# Patient Record
Sex: Male | Born: 1950
Health system: Southern US, Community
[De-identification: ages and names within clinical notes are randomized; demographics above are authoritative.]

## PROBLEM LIST (undated history)

## (undated) DIAGNOSIS — M109 Gout, unspecified: Secondary | ICD-10-CM

## (undated) DIAGNOSIS — Z87442 Personal history of urinary calculi: Secondary | ICD-10-CM

## (undated) DIAGNOSIS — I10 Essential (primary) hypertension: Secondary | ICD-10-CM

## (undated) HISTORY — PX: TONSILLECTOMY: SUR1361

---

## 2002-11-30 ENCOUNTER — Encounter (INDEPENDENT_AMBULATORY_CARE_PROVIDER_SITE_OTHER): Payer: Self-pay | Admitting: Specialist

## 2002-11-30 ENCOUNTER — Ambulatory Visit (HOSPITAL_COMMUNITY): Admission: RE | Admit: 2002-11-30 | Discharge: 2002-11-30 | Payer: Self-pay | Admitting: Gastroenterology

## 2003-01-27 ENCOUNTER — Encounter: Payer: Self-pay | Admitting: Rheumatology

## 2003-01-27 ENCOUNTER — Encounter: Admission: RE | Admit: 2003-01-27 | Discharge: 2003-01-27 | Payer: Self-pay | Admitting: Rheumatology

## 2003-12-02 ENCOUNTER — Emergency Department (HOSPITAL_COMMUNITY): Admission: EM | Admit: 2003-12-02 | Discharge: 2003-12-02 | Payer: Self-pay | Admitting: Emergency Medicine

## 2008-02-17 ENCOUNTER — Encounter: Admission: RE | Admit: 2008-02-17 | Discharge: 2008-02-17 | Payer: Self-pay | Admitting: Rheumatology

## 2011-05-02 NOTE — Op Note (Signed)
   NAME:  David Snow, David Snow                     ACCOUNT NO.:  [id number]   MEDICAL RECORD NO.:  [id number]                   PATIENT TYPE:  AMB   LOCATION:  ENDO                                 FACILITY:  [facility]   PHYSICIAN:  Danise Edge, M.D.                DATE OF BIRTH:  [birth year]   DATE OF PROCEDURE:  11/30/2002  DATE OF DISCHARGE:                                 OPERATIVE REPORT   PROCEDURE:  Screening colonoscopy.   INDICATIONS FOR PROCEDURE:  David Snow is a 60 year old male  born 1951/05/17. David Snow is scheduled to undergo his first screening  colonoscopy with polypectomy to prevent colon cancer. I discussed with Mr.  Snow the  complications associated with colonoscopy and polypectomy including a  15/1000 risk of bleeding and 12/998 risk of colon perforation requiring  surgical repair. David Snow has signed the operative permit.   ENDOSCOPIST:  Charolett Bumpers, M.D.   PREMEDICATION:  Versed 7.5 mg, Demerol 50 mg .   ENDOSCOPE:  Olympus pediatric colonoscope.   DESCRIPTION OF PROCEDURE:  After obtaining informed consent, David Snow was  placed in the left lateral decubitus position. I administered intravenous  Demerol and intravenous Versed to achieve conscious sedation for the  procedure. The patient's blood pressure, oxygen saturation and cardiac  rhythm were monitored throughout the procedure and documented in the medical  record.   Anal inspection was normal. Digital rectal exam was normal.  The Olympus  pediatric video colonoscope was introduced into the rectum and advanced to  the cecum. A normal appearing ileocecal valve was intubated.  Colonic  preparation for the exam today was excellent.   RECTUM:  Normal.   SIGMOID COLON AND DESCENDING COLON:  From the distal sigmoid colon at 25 cm  from the anal verge, a 1 mm sessile polyp was removed with the hot biopsy  forceps.   SPLENIC FLEXURE:  Normal.   TRANSVERSE COLON:  Normal.   HEPATIC FLEXURE:  Normal.   ASCENDING COLON:  Normal.   CECUM AND ILEOCECAL VALVE:  Normal.   DISTAL ILEUM:  Normal.   ASSESSMENT:  From the distal sigmoid colon, a 1 mm sessile polyp was removed  with the hot biopsy forceps; otherwise normal proctocolonoscopy to the  cecum.   RECOMMENDATIONS:  Repeat colonoscopy in approximately five years.                                               Danise Edge, M.D.    MJ/MEDQ  D:  11/30/2002  T:  11/30/2002  Job:  [id number]   cc:   Demetria Pore. Coral Spikes, M.D.  [address]  [address]  [place]  [place] [address]  Fax: [phone]

## 2014-10-07 DIAGNOSIS — Z23 Encounter for immunization: Secondary | ICD-10-CM | POA: Diagnosis not present

## 2015-01-10 ENCOUNTER — Other Ambulatory Visit: Payer: Self-pay | Admitting: Geriatric Medicine

## 2015-01-10 DIAGNOSIS — E782 Mixed hyperlipidemia: Secondary | ICD-10-CM | POA: Diagnosis not present

## 2015-01-10 DIAGNOSIS — Z1389 Encounter for screening for other disorder: Secondary | ICD-10-CM | POA: Diagnosis not present

## 2015-01-10 DIAGNOSIS — E669 Obesity, unspecified: Secondary | ICD-10-CM | POA: Diagnosis not present

## 2015-01-10 DIAGNOSIS — F17211 Nicotine dependence, cigarettes, in remission: Secondary | ICD-10-CM | POA: Diagnosis not present

## 2015-01-10 DIAGNOSIS — Z79899 Other long term (current) drug therapy: Secondary | ICD-10-CM | POA: Diagnosis not present

## 2015-01-10 DIAGNOSIS — M1A9XX1 Chronic gout, unspecified, with tophus (tophi): Secondary | ICD-10-CM | POA: Diagnosis not present

## 2015-01-10 DIAGNOSIS — F17210 Nicotine dependence, cigarettes, uncomplicated: Secondary | ICD-10-CM

## 2015-01-10 DIAGNOSIS — I10 Essential (primary) hypertension: Secondary | ICD-10-CM | POA: Diagnosis not present

## 2015-01-10 DIAGNOSIS — Z0000 Encounter for general adult medical examination without abnormal findings: Secondary | ICD-10-CM | POA: Diagnosis not present

## 2015-01-10 DIAGNOSIS — Z6832 Body mass index (BMI) 32.0-32.9, adult: Secondary | ICD-10-CM | POA: Diagnosis not present

## 2015-01-17 ENCOUNTER — Ambulatory Visit
Admission: RE | Admit: 2015-01-17 | Discharge: 2015-01-17 | Disposition: A | Discharge status: Home / Self Care | Payer: Medicare Other | Source: Ambulatory Visit | Attending: Geriatric Medicine | Admitting: Geriatric Medicine

## 2015-01-17 DIAGNOSIS — F17210 Nicotine dependence, cigarettes, uncomplicated: Secondary | ICD-10-CM

## 2015-01-17 DIAGNOSIS — Z87891 Personal history of nicotine dependence: Secondary | ICD-10-CM | POA: Diagnosis not present

## 2015-01-17 DIAGNOSIS — Z122 Encounter for screening for malignant neoplasm of respiratory organs: Secondary | ICD-10-CM | POA: Diagnosis not present

## 2015-01-24 DIAGNOSIS — F17200 Nicotine dependence, unspecified, uncomplicated: Secondary | ICD-10-CM | POA: Diagnosis not present

## 2015-05-24 DIAGNOSIS — R05 Cough: Secondary | ICD-10-CM | POA: Diagnosis not present

## 2015-05-24 DIAGNOSIS — J309 Allergic rhinitis, unspecified: Secondary | ICD-10-CM | POA: Diagnosis not present

## 2015-07-11 DIAGNOSIS — Z79899 Other long term (current) drug therapy: Secondary | ICD-10-CM | POA: Diagnosis not present

## 2015-07-11 DIAGNOSIS — I10 Essential (primary) hypertension: Secondary | ICD-10-CM | POA: Diagnosis not present

## 2015-07-11 DIAGNOSIS — M1A9XX1 Chronic gout, unspecified, with tophus (tophi): Secondary | ICD-10-CM | POA: Diagnosis not present

## 2015-08-29 DIAGNOSIS — H2513 Age-related nuclear cataract, bilateral: Secondary | ICD-10-CM | POA: Diagnosis not present

## 2015-08-29 DIAGNOSIS — H1711 Central corneal opacity, right eye: Secondary | ICD-10-CM | POA: Diagnosis not present

## 2015-08-29 DIAGNOSIS — H1851 Endothelial corneal dystrophy: Secondary | ICD-10-CM | POA: Diagnosis not present

## 2015-08-29 DIAGNOSIS — H31093 Other chorioretinal scars, bilateral: Secondary | ICD-10-CM | POA: Diagnosis not present

## 2015-10-02 DIAGNOSIS — Z23 Encounter for immunization: Secondary | ICD-10-CM | POA: Diagnosis not present

## 2016-01-21 ENCOUNTER — Other Ambulatory Visit: Payer: Self-pay | Admitting: Acute Care

## 2016-01-21 DIAGNOSIS — F17210 Nicotine dependence, cigarettes, uncomplicated: Principal | ICD-10-CM

## 2016-02-07 ENCOUNTER — Ambulatory Visit: Payer: Medicare Other

## 2016-02-07 ENCOUNTER — Ambulatory Visit
Admission: RE | Admit: 2016-02-07 | Discharge: 2016-02-07 | Disposition: A | Discharge status: Home / Self Care | Payer: Medicare Other | Source: Ambulatory Visit | Attending: Acute Care | Admitting: Acute Care

## 2016-02-07 DIAGNOSIS — Z87891 Personal history of nicotine dependence: Secondary | ICD-10-CM | POA: Diagnosis not present

## 2016-02-07 DIAGNOSIS — F17210 Nicotine dependence, cigarettes, uncomplicated: Principal | ICD-10-CM

## 2016-02-28 DIAGNOSIS — I10 Essential (primary) hypertension: Secondary | ICD-10-CM | POA: Diagnosis not present

## 2016-02-28 DIAGNOSIS — Z125 Encounter for screening for malignant neoplasm of prostate: Secondary | ICD-10-CM | POA: Diagnosis not present

## 2016-02-28 DIAGNOSIS — M1A9XX1 Chronic gout, unspecified, with tophus (tophi): Secondary | ICD-10-CM | POA: Diagnosis not present

## 2016-02-28 DIAGNOSIS — Z23 Encounter for immunization: Secondary | ICD-10-CM | POA: Diagnosis not present

## 2016-02-28 DIAGNOSIS — Z0000 Encounter for general adult medical examination without abnormal findings: Secondary | ICD-10-CM | POA: Diagnosis not present

## 2016-02-28 DIAGNOSIS — Z1389 Encounter for screening for other disorder: Secondary | ICD-10-CM | POA: Diagnosis not present

## 2016-02-28 DIAGNOSIS — F17210 Nicotine dependence, cigarettes, uncomplicated: Secondary | ICD-10-CM | POA: Diagnosis not present

## 2016-02-28 DIAGNOSIS — E782 Mixed hyperlipidemia: Secondary | ICD-10-CM | POA: Diagnosis not present

## 2016-02-28 DIAGNOSIS — Z79899 Other long term (current) drug therapy: Secondary | ICD-10-CM | POA: Diagnosis not present

## 2016-09-01 DIAGNOSIS — N521 Erectile dysfunction due to diseases classified elsewhere: Secondary | ICD-10-CM | POA: Diagnosis not present

## 2016-09-01 DIAGNOSIS — Z6831 Body mass index (BMI) 31.0-31.9, adult: Secondary | ICD-10-CM | POA: Diagnosis not present

## 2016-09-01 DIAGNOSIS — E669 Obesity, unspecified: Secondary | ICD-10-CM | POA: Diagnosis not present

## 2016-09-01 DIAGNOSIS — I10 Essential (primary) hypertension: Secondary | ICD-10-CM | POA: Diagnosis not present

## 2016-12-17 ENCOUNTER — Other Ambulatory Visit: Payer: Self-pay | Admitting: Acute Care

## 2016-12-17 DIAGNOSIS — F17210 Nicotine dependence, cigarettes, uncomplicated: Principal | ICD-10-CM

## 2017-02-06 ENCOUNTER — Ambulatory Visit
Admission: RE | Admit: 2017-02-06 | Discharge: 2017-02-06 | Disposition: A | Discharge status: Home / Self Care | Payer: Medicare Other | Source: Ambulatory Visit | Attending: Acute Care | Admitting: Acute Care

## 2017-02-06 DIAGNOSIS — F17210 Nicotine dependence, cigarettes, uncomplicated: Secondary | ICD-10-CM | POA: Diagnosis not present

## 2017-02-17 ENCOUNTER — Telehealth: Payer: Self-pay | Admitting: Acute Care

## 2017-02-17 DIAGNOSIS — F17210 Nicotine dependence, cigarettes, uncomplicated: Principal | ICD-10-CM

## 2017-02-17 NOTE — Telephone Encounter (Signed)
LMTC x 1 to give pt ct results.  (Order placed for 1 yr f/u ct and copy sent to Dr Felipa Eth)

## 2017-02-26 NOTE — Telephone Encounter (Signed)
LMTC x 1  

## 2017-03-09 ENCOUNTER — Encounter: Payer: Self-pay | Admitting: *Deleted

## 2017-03-09 NOTE — Telephone Encounter (Signed)
Letter sent to pt with CT results.  Nothing further needed

## 2017-04-13 DIAGNOSIS — I700 Atherosclerosis of aorta: Secondary | ICD-10-CM | POA: Diagnosis not present

## 2017-04-13 DIAGNOSIS — M1A9XX1 Chronic gout, unspecified, with tophus (tophi): Secondary | ICD-10-CM | POA: Diagnosis not present

## 2017-04-13 DIAGNOSIS — E781 Pure hyperglyceridemia: Secondary | ICD-10-CM | POA: Diagnosis not present

## 2017-04-13 DIAGNOSIS — F17210 Nicotine dependence, cigarettes, uncomplicated: Secondary | ICD-10-CM | POA: Diagnosis not present

## 2017-04-13 DIAGNOSIS — Z79899 Other long term (current) drug therapy: Secondary | ICD-10-CM | POA: Diagnosis not present

## 2017-04-13 DIAGNOSIS — Z125 Encounter for screening for malignant neoplasm of prostate: Secondary | ICD-10-CM | POA: Diagnosis not present

## 2017-04-13 DIAGNOSIS — Z6831 Body mass index (BMI) 31.0-31.9, adult: Secondary | ICD-10-CM | POA: Diagnosis not present

## 2017-04-13 DIAGNOSIS — Z0000 Encounter for general adult medical examination without abnormal findings: Secondary | ICD-10-CM | POA: Diagnosis not present

## 2017-04-13 DIAGNOSIS — E669 Obesity, unspecified: Secondary | ICD-10-CM | POA: Diagnosis not present

## 2017-04-13 DIAGNOSIS — I10 Essential (primary) hypertension: Secondary | ICD-10-CM | POA: Diagnosis not present

## 2017-04-13 DIAGNOSIS — Z1389 Encounter for screening for other disorder: Secondary | ICD-10-CM | POA: Diagnosis not present

## 2017-04-13 DIAGNOSIS — E782 Mixed hyperlipidemia: Secondary | ICD-10-CM | POA: Diagnosis not present

## 2017-06-10 DIAGNOSIS — E781 Pure hyperglyceridemia: Secondary | ICD-10-CM | POA: Diagnosis not present

## 2017-10-13 DIAGNOSIS — Z6831 Body mass index (BMI) 31.0-31.9, adult: Secondary | ICD-10-CM | POA: Diagnosis not present

## 2017-10-13 DIAGNOSIS — I10 Essential (primary) hypertension: Secondary | ICD-10-CM | POA: Diagnosis not present

## 2017-10-13 DIAGNOSIS — Z23 Encounter for immunization: Secondary | ICD-10-CM | POA: Diagnosis not present

## 2017-10-13 DIAGNOSIS — E669 Obesity, unspecified: Secondary | ICD-10-CM | POA: Diagnosis not present

## 2017-10-13 DIAGNOSIS — Z79899 Other long term (current) drug therapy: Secondary | ICD-10-CM | POA: Diagnosis not present

## 2017-10-13 DIAGNOSIS — R200 Anesthesia of skin: Secondary | ICD-10-CM | POA: Diagnosis not present

## 2017-11-03 DIAGNOSIS — H02831 Dermatochalasis of right upper eyelid: Secondary | ICD-10-CM | POA: Diagnosis not present

## 2017-11-03 DIAGNOSIS — H2513 Age-related nuclear cataract, bilateral: Secondary | ICD-10-CM | POA: Diagnosis not present

## 2017-11-03 DIAGNOSIS — H31093 Other chorioretinal scars, bilateral: Secondary | ICD-10-CM | POA: Diagnosis not present

## 2017-11-03 DIAGNOSIS — H1851 Endothelial corneal dystrophy: Secondary | ICD-10-CM | POA: Diagnosis not present

## 2017-11-03 DIAGNOSIS — H02834 Dermatochalasis of left upper eyelid: Secondary | ICD-10-CM | POA: Diagnosis not present

## 2017-11-03 DIAGNOSIS — H1711 Central corneal opacity, right eye: Secondary | ICD-10-CM | POA: Diagnosis not present

## 2018-03-04 ENCOUNTER — Inpatient Hospital Stay
Admission: RE | Admit: 2018-03-04 | Discharge: 2018-03-04 | Disposition: A | Discharge status: Home / Self Care | Payer: Medicare Other | Source: Ambulatory Visit | Attending: Acute Care | Admitting: Acute Care

## 2018-04-01 DIAGNOSIS — H00024 Hordeolum internum left upper eyelid: Secondary | ICD-10-CM | POA: Diagnosis not present

## 2018-04-13 ENCOUNTER — Ambulatory Visit
Admission: RE | Admit: 2018-04-13 | Discharge: 2018-04-13 | Disposition: A | Discharge status: Home / Self Care | Payer: Medicare Other | Source: Ambulatory Visit | Attending: Acute Care | Admitting: Acute Care

## 2018-04-13 DIAGNOSIS — F17210 Nicotine dependence, cigarettes, uncomplicated: Secondary | ICD-10-CM | POA: Diagnosis not present

## 2018-04-14 DIAGNOSIS — M71349 Other bursal cyst, unspecified hand: Secondary | ICD-10-CM | POA: Diagnosis not present

## 2018-04-14 DIAGNOSIS — Z0000 Encounter for general adult medical examination without abnormal findings: Secondary | ICD-10-CM | POA: Diagnosis not present

## 2018-04-14 DIAGNOSIS — Z125 Encounter for screening for malignant neoplasm of prostate: Secondary | ICD-10-CM | POA: Diagnosis not present

## 2018-04-14 DIAGNOSIS — E782 Mixed hyperlipidemia: Secondary | ICD-10-CM | POA: Diagnosis not present

## 2018-04-14 DIAGNOSIS — Z1389 Encounter for screening for other disorder: Secondary | ICD-10-CM | POA: Diagnosis not present

## 2018-04-14 DIAGNOSIS — F17210 Nicotine dependence, cigarettes, uncomplicated: Secondary | ICD-10-CM | POA: Diagnosis not present

## 2018-04-14 DIAGNOSIS — R202 Paresthesia of skin: Secondary | ICD-10-CM | POA: Diagnosis not present

## 2018-04-14 DIAGNOSIS — E669 Obesity, unspecified: Secondary | ICD-10-CM | POA: Diagnosis not present

## 2018-04-14 DIAGNOSIS — Z23 Encounter for immunization: Secondary | ICD-10-CM | POA: Diagnosis not present

## 2018-04-14 DIAGNOSIS — I10 Essential (primary) hypertension: Secondary | ICD-10-CM | POA: Diagnosis not present

## 2018-04-14 DIAGNOSIS — Z6831 Body mass index (BMI) 31.0-31.9, adult: Secondary | ICD-10-CM | POA: Diagnosis not present

## 2018-04-14 DIAGNOSIS — Z79899 Other long term (current) drug therapy: Secondary | ICD-10-CM | POA: Diagnosis not present

## 2018-04-19 DIAGNOSIS — G5600 Carpal tunnel syndrome, unspecified upper limb: Secondary | ICD-10-CM | POA: Diagnosis not present

## 2018-04-19 DIAGNOSIS — G629 Polyneuropathy, unspecified: Secondary | ICD-10-CM | POA: Diagnosis not present

## 2018-04-19 DIAGNOSIS — H00024 Hordeolum internum left upper eyelid: Secondary | ICD-10-CM | POA: Diagnosis not present

## 2018-04-23 ENCOUNTER — Other Ambulatory Visit: Payer: Self-pay | Admitting: Acute Care

## 2018-04-23 DIAGNOSIS — Z122 Encounter for screening for malignant neoplasm of respiratory organs: Secondary | ICD-10-CM

## 2018-04-23 DIAGNOSIS — F17210 Nicotine dependence, cigarettes, uncomplicated: Principal | ICD-10-CM

## 2018-05-05 DIAGNOSIS — M19041 Primary osteoarthritis, right hand: Secondary | ICD-10-CM | POA: Diagnosis not present

## 2018-05-05 DIAGNOSIS — M1A09X1 Idiopathic chronic gout, multiple sites, with tophus (tophi): Secondary | ICD-10-CM | POA: Diagnosis not present

## 2018-05-05 DIAGNOSIS — M19042 Primary osteoarthritis, left hand: Secondary | ICD-10-CM | POA: Diagnosis not present

## 2018-05-05 DIAGNOSIS — M19131 Post-traumatic osteoarthritis, right wrist: Secondary | ICD-10-CM | POA: Diagnosis not present

## 2018-05-05 DIAGNOSIS — G5603 Carpal tunnel syndrome, bilateral upper limbs: Secondary | ICD-10-CM | POA: Diagnosis not present

## 2018-06-09 DIAGNOSIS — D3501 Benign neoplasm of right adrenal gland: Secondary | ICD-10-CM | POA: Diagnosis not present

## 2018-06-10 DIAGNOSIS — D3501 Benign neoplasm of right adrenal gland: Secondary | ICD-10-CM | POA: Diagnosis not present

## 2018-08-05 ENCOUNTER — Other Ambulatory Visit: Payer: Self-pay | Admitting: Orthopedic Surgery

## 2018-08-18 ENCOUNTER — Encounter (HOSPITAL_BASED_OUTPATIENT_CLINIC_OR_DEPARTMENT_OTHER): Payer: Self-pay | Admitting: *Deleted

## 2018-08-18 ENCOUNTER — Other Ambulatory Visit: Payer: Self-pay

## 2018-09-08 ENCOUNTER — Encounter (HOSPITAL_BASED_OUTPATIENT_CLINIC_OR_DEPARTMENT_OTHER)
Admission: RE | Admit: 2018-09-08 | Discharge: 2018-09-08 | Disposition: A | Discharge status: Home / Self Care | Payer: Medicare Other | Source: Ambulatory Visit | Attending: Orthopedic Surgery | Admitting: Orthopedic Surgery

## 2018-09-08 DIAGNOSIS — Z01810 Encounter for preprocedural cardiovascular examination: Secondary | ICD-10-CM | POA: Insufficient documentation

## 2018-09-08 DIAGNOSIS — I10 Essential (primary) hypertension: Secondary | ICD-10-CM | POA: Diagnosis not present

## 2018-09-14 ENCOUNTER — Ambulatory Visit (HOSPITAL_BASED_OUTPATIENT_CLINIC_OR_DEPARTMENT_OTHER)
Admission: RE | Admit: 2018-09-14 | Discharge: 2018-09-14 | Disposition: A | Discharge status: Home / Self Care | Payer: Medicare Other | Source: Ambulatory Visit | Attending: Orthopedic Surgery | Admitting: Orthopedic Surgery

## 2018-09-14 ENCOUNTER — Encounter (HOSPITAL_BASED_OUTPATIENT_CLINIC_OR_DEPARTMENT_OTHER)
Admission: RE | Disposition: A | Discharge status: Home / Self Care | Payer: Self-pay | Source: Ambulatory Visit | Attending: Orthopedic Surgery

## 2018-09-14 ENCOUNTER — Other Ambulatory Visit: Payer: Self-pay

## 2018-09-14 ENCOUNTER — Encounter (HOSPITAL_BASED_OUTPATIENT_CLINIC_OR_DEPARTMENT_OTHER): Payer: Self-pay

## 2018-09-14 ENCOUNTER — Ambulatory Visit (HOSPITAL_BASED_OUTPATIENT_CLINIC_OR_DEPARTMENT_OTHER): Payer: Medicare Other | Admitting: Anesthesiology

## 2018-09-14 DIAGNOSIS — M19041 Primary osteoarthritis, right hand: Secondary | ICD-10-CM | POA: Diagnosis not present

## 2018-09-14 DIAGNOSIS — I10 Essential (primary) hypertension: Secondary | ICD-10-CM | POA: Insufficient documentation

## 2018-09-14 DIAGNOSIS — G5601 Carpal tunnel syndrome, right upper limb: Secondary | ICD-10-CM | POA: Insufficient documentation

## 2018-09-14 DIAGNOSIS — F17210 Nicotine dependence, cigarettes, uncomplicated: Secondary | ICD-10-CM | POA: Insufficient documentation

## 2018-09-14 DIAGNOSIS — M19042 Primary osteoarthritis, left hand: Secondary | ICD-10-CM | POA: Insufficient documentation

## 2018-09-14 DIAGNOSIS — M1A09X1 Idiopathic chronic gout, multiple sites, with tophus (tophi): Secondary | ICD-10-CM | POA: Insufficient documentation

## 2018-09-14 DIAGNOSIS — M67431 Ganglion, right wrist: Secondary | ICD-10-CM | POA: Diagnosis not present

## 2018-09-14 DIAGNOSIS — L729 Follicular cyst of the skin and subcutaneous tissue, unspecified: Secondary | ICD-10-CM | POA: Diagnosis not present

## 2018-09-14 HISTORY — PX: CARPAL TUNNEL RELEASE: SHX101

## 2018-09-14 HISTORY — DX: Personal history of urinary calculi: Z87.442

## 2018-09-14 HISTORY — DX: Essential (primary) hypertension: I10

## 2018-09-14 HISTORY — DX: Gout, unspecified: M10.9

## 2018-09-14 SURGERY — CARPAL TUNNEL RELEASE
Anesthesia: Monitor Anesthesia Care | Site: Wrist | Laterality: Right

## 2018-09-14 MED ORDER — FENTANYL CITRATE (PF) 100 MCG/2ML IJ SOLN
50.0000 ug | INTRAMUSCULAR | Status: DC | PRN
  Administered 2018-09-14 (×2): 50 ug via INTRAVENOUS

## 2018-09-14 MED ORDER — LACTATED RINGERS IV SOLN
INTRAVENOUS | Status: DC
  Administered 2018-09-14 (×2): via INTRAVENOUS

## 2018-09-14 MED ORDER — CEFAZOLIN SODIUM-DEXTROSE 2-4 GM/100ML-% IV SOLN
INTRAVENOUS | Status: AC
  Filled 2018-09-14: qty 100

## 2018-09-14 MED ORDER — BUPIVACAINE HCL (PF) 0.25 % IJ SOLN
INTRAMUSCULAR | Status: DC | PRN
  Administered 2018-09-14: 10 mL

## 2018-09-14 MED ORDER — MIDAZOLAM HCL 2 MG/2ML IJ SOLN
INTRAMUSCULAR | Status: AC
  Filled 2018-09-14: qty 2

## 2018-09-14 MED ORDER — FENTANYL CITRATE (PF) 100 MCG/2ML IJ SOLN
INTRAMUSCULAR | Status: AC
  Filled 2018-09-14: qty 2

## 2018-09-14 MED ORDER — CHLORHEXIDINE GLUCONATE 4 % EX LIQD: 60.0000 mL | Freq: Once | CUTANEOUS | Status: DC

## 2018-09-14 MED ORDER — MIDAZOLAM HCL 2 MG/2ML IJ SOLN
1.0000 mg | INTRAMUSCULAR | Status: DC | PRN
  Administered 2018-09-14 (×2): 1 mg via INTRAVENOUS

## 2018-09-14 MED ORDER — BUPIVACAINE HCL (PF) 0.25 % IJ SOLN
INTRAMUSCULAR | Status: AC
  Filled 2018-09-14: qty 90

## 2018-09-14 MED ORDER — SCOPOLAMINE 1 MG/3DAYS TD PT72: 1.0000 | MEDICATED_PATCH | Freq: Once | TRANSDERMAL | Status: DC | PRN

## 2018-09-14 MED ORDER — PROPOFOL 10 MG/ML IV BOLUS
INTRAVENOUS | Status: DC | PRN
  Administered 2018-09-14 (×4): 20 mg via INTRAVENOUS

## 2018-09-14 MED ORDER — CEFAZOLIN SODIUM-DEXTROSE 2-4 GM/100ML-% IV SOLN
2.0000 g | INTRAVENOUS | Status: AC
  Administered 2018-09-14: 2 g via INTRAVENOUS

## 2018-09-14 MED ORDER — FENTANYL CITRATE (PF) 100 MCG/2ML IJ SOLN: 25.0000 ug | INTRAMUSCULAR | Status: DC | PRN

## 2018-09-14 MED ORDER — HYDROCODONE-ACETAMINOPHEN 5-325 MG PO TABS: 1.0000 | ORAL_TABLET | Freq: Four times a day (QID) | ORAL | 0 refills | Status: AC | PRN

## 2018-09-14 MED ORDER — ONDANSETRON HCL 4 MG/2ML IJ SOLN
INTRAMUSCULAR | Status: DC | PRN
  Administered 2018-09-14: 4 mg via INTRAVENOUS

## 2018-09-14 MED ORDER — LIDOCAINE HCL (PF) 0.5 % IJ SOLN
INTRAMUSCULAR | Status: DC | PRN
  Administered 2018-09-14: 50 mL via INTRAVENOUS

## 2018-09-14 SURGICAL SUPPLY — 33 items
BLADE SURG 15 STRL LF DISP TIS (BLADE) ×1 IMPLANT
BLADE SURG 15 STRL SS (BLADE) ×3
BNDG CMPR 9X4 STRL LF SNTH (GAUZE/BANDAGES/DRESSINGS) ×1
BNDG COHESIVE 3X5 TAN STRL LF (GAUZE/BANDAGES/DRESSINGS) ×3 IMPLANT
BNDG ESMARK 4X9 LF (GAUZE/BANDAGES/DRESSINGS) ×2 IMPLANT
BNDG GAUZE ELAST 4 BULKY (GAUZE/BANDAGES/DRESSINGS) ×3 IMPLANT
CHLORAPREP W/TINT 26ML (MISCELLANEOUS) ×3 IMPLANT
CORD BIPOLAR FORCEPS 12FT (ELECTRODE) ×3 IMPLANT
COVER BACK TABLE 60X90IN (DRAPES) ×3 IMPLANT
COVER MAYO STAND STRL (DRAPES) ×3 IMPLANT
CUFF TOURNIQUET SINGLE 18IN (TOURNIQUET CUFF) ×3 IMPLANT
DRAPE EXTREMITY T 121X128X90 (DRAPE) ×3 IMPLANT
DRAPE SURG 17X23 STRL (DRAPES) ×3 IMPLANT
DRSG PAD ABDOMINAL 8X10 ST (GAUZE/BANDAGES/DRESSINGS) ×3 IMPLANT
GAUZE SPONGE 4X4 12PLY STRL (GAUZE/BANDAGES/DRESSINGS) ×3 IMPLANT
GAUZE XEROFORM 1X8 LF (GAUZE/BANDAGES/DRESSINGS) ×3 IMPLANT
GLOVE BIOGEL PI IND STRL 8.5 (GLOVE) ×1 IMPLANT
GLOVE BIOGEL PI INDICATOR 8.5 (GLOVE) ×2
GLOVE SURG ORTHO 8.0 STRL STRW (GLOVE) ×3 IMPLANT
GOWN STRL REUS W/ TWL LRG LVL3 (GOWN DISPOSABLE) ×1 IMPLANT
GOWN STRL REUS W/TWL LRG LVL3 (GOWN DISPOSABLE) ×3
GOWN STRL REUS W/TWL XL LVL3 (GOWN DISPOSABLE) ×3 IMPLANT
NDL PRECISIONGLIDE 27X1.5 (NEEDLE) IMPLANT
NEEDLE PRECISIONGLIDE 27X1.5 (NEEDLE) IMPLANT
NS IRRIG 1000ML POUR BTL (IV SOLUTION) ×3 IMPLANT
PACK BASIN DAY SURGERY FS (CUSTOM PROCEDURE TRAY) ×3 IMPLANT
STOCKINETTE 4X48 STRL (DRAPES) ×3 IMPLANT
SUT ETHILON 4 0 PS 2 18 (SUTURE) ×5 IMPLANT
SUT VICRYL 4-0 PS2 18IN ABS (SUTURE) ×2 IMPLANT
SYR BULB 3OZ (MISCELLANEOUS) ×3 IMPLANT
SYR CONTROL 10ML LL (SYRINGE) ×2 IMPLANT
TOWEL GREEN STERILE FF (TOWEL DISPOSABLE) ×3 IMPLANT
UNDERPAD 30X30 (UNDERPADS AND DIAPERS) ×3 IMPLANT

## 2018-09-14 NOTE — Discharge Instructions (Addendum)

## 2018-09-14 NOTE — Anesthesia Preprocedure Evaluation (Addendum)
Anesthesia Evaluation  Patient identified by MRN, date of birth, ID band Patient awake    Reviewed: Allergy & Precautions, NPO status , Patient's Chart, lab work & pertinent test results  Airway Mallampati: I  TM Distance: >3 FB Neck ROM: Full    Dental no notable dental hx. (+) Teeth Intact, Dental Advisory Given   Pulmonary Current Smoker,    Pulmonary exam normal breath sounds clear to auscultation       Cardiovascular hypertension, Pt. on medications Normal cardiovascular exam Rhythm:Regular Rate:Normal     Neuro/Psych negative neurological ROS  negative psych ROS   GI/Hepatic negative GI ROS, Neg liver ROS,   Endo/Other  negative endocrine ROS  Renal/GU negative Renal ROS  negative genitourinary   Musculoskeletal negative musculoskeletal ROS (+)   Abdominal   Peds  Hematology negative hematology ROS (+)   Anesthesia Other Findings Right carpal tunnel syndrome, gout  Reproductive/Obstetrics                            Anesthesia Physical Anesthesia Plan  ASA: II  Anesthesia Plan: Bier Block and MAC and Bier Block-LIDOCAINE ONLY   Post-op Pain Management:    Induction: Intravenous  PONV Risk Score and Plan: 0 and Dexamethasone, Ondansetron and Treatment may vary due to age or medical condition  Airway Management Planned: Natural Airway and Simple Face Mask  Additional Equipment:   Intra-op Plan:   Post-operative Plan:   Informed Consent: I have reviewed the patients History and Physical, chart, labs and discussed the procedure including the risks, benefits and alternatives for the proposed anesthesia with the patient or authorized representative who has indicated his/her understanding and acceptance.   Dental advisory given  Plan Discussed with: CRNA  Anesthesia Plan Comments:        Anesthesia Quick Evaluation

## 2018-09-14 NOTE — Op Note (Signed)
NAME: David Snow MEDICAL RECORD NO: [id number] DATE OF BIRTH: 10-23-1951 FACILITY: [facility] LOCATION: [facility] PHYSICIAN: David Sours, MD   OPERATIVE REPORT   DATE OF PROCEDURE: 09/14/18    PREOPERATIVE DIAGNOSIS:   Carpal tunnel syndrome right hand mass volar radial right wrist   POSTOPERATIVE DIAGNOSIS:   Same   PROCEDURE:   Carpal tunnel release right hand with excision of the large wrist ganglion right wrist   SURGEON: David Snow, M.D.   ASSISTANT: none   ANESTHESIA:  Bier block with sedation and Local   INTRAVENOUS FLUIDS:  Per anesthesia flow sheet.   ESTIMATED BLOOD LOSS:  Minimal.   COMPLICATIONS:  None.   SPECIMENS:   Excision mass right wrist   TOURNIQUET TIME:   * Missing tourniquet times found for documented tourniquets in log: [id number] *   DISPOSITION:  Stable to PACU.   INDICATIONS: Patient is a 67 year old male with history of numbness and tingling right hand.  Nerve conduction positive for carpal tunnel syndrome.  He has a large mass of the volar radial aspect of his wrist which he would like to have removed also.  Pre-peri-and postoperative course been discussed along with risks and complications.  He is aware that there is no guarantee to the surgery the possibility of infection recurrence injury to arteries nerves tendons complete relief of symptoms and dystrophy.  In preoperative area the patient seen extremity marked by both patient and surgeon antibiotic given  OPERATIVE COURSE: Patient brought to the operating room where an upper arm IV regional anesthetic was carried out without difficulty under the direction the anesthesia department.  Was prepped and draped in supine position with the right arm free.  ChloraPrep was used for prep a 3-minute dry time timeout taken to confirm patient procedure.  A longitudinal incision was made in the right palm carried down through subcutaneous tissue.  Bleeders were electrocauterized with bipolar.   The palmar fascia was split.  The superficial palmar arch was identified.  Flexor tendon of the ring little finger was identified.  Retractors were placed retracted median nerve radially ulnar nerve ulnarly.  The flexor retinaculum was then released on its ulnar border.  Right angle and saw retractor placed between skin and forearm fascia deep structures dissected free from the proximal aspect of the flexor retinaculum distal forearm fascia in this fashion retinaculum was then released approximately 3 cm proximal to the wrist crease.  The canal was explored.  The motor branch entered in the muscle distally.  An area compression to the nerve was apparent with the hourglass deformity and hyperemia in the area of deformity.  Was copious irrigated with saline.  The skin was closed with interrupted 4 nylon sutures.  A separate incision was made then made over the volar radial aspect of his wrist carried down through subcutaneous tissue a large mass was immediately encountered.  This measured approximately 2-1/2 cm in diameter.  Sharp dissection was dissected free found to be a large ganglion cyst.  Radial artery was identified and protected.  The dissection was carried down into the radiocarpal joint.  This was opened and debrided with a rondure.  Specimen was sent to pathology.  Wound was copiously irrigated with saline.  The opening in the radiocarpal joint was closed with figure-of-eight 4-0 Vicryl sutures.  Subcutaneous tissue was closed with interrupted 4-0 Vicryl the skin with interrupted 4 nylon sutures.  Local infiltration quarter percent bupivacaine without epinephrine was given to each wound.  Approximately 9 cc was used.  A sterile compressive dressing with the fingers free was applied.  The tourniquet deflated all fingers immediately pinked a volar splint was applied along with a compressive dressing.  He was taken to the recovery room for observation in satisfactory condition.  He will be discharged home to  return David Snow agrees were in 1 week on Norco.  He will try ibuprofen Tylenol first if this is inadequate he will have the [place] for breakthrough.   David Brod, MD Electronically signed, 09/14/18

## 2018-09-14 NOTE — H&P (Signed)
David Snow is an 67 y.o. male.   Chief Complaint: numbness right hand and mass HPI: David Snow is a 67 year old right-hand-dominant male referred by Dr. Financial trader for consultation regarding numbness and tingling both hands. Right much greater than left. All fingers are involved. Is constant. He is dropping things. Is been going on for years. He is awakened 7 out of 7 nights. He states driving makes this worse with having to shake his hand. He has no history of injury to his neck. He has no fracture on his right wrist at the age of 62. He did not have any treatment for this. He has had no discrete treatment for the numbness and tingling at the present time. 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 He has had nerve conductions done by Dr. Domingo Cocking. These were performed on 04/19/2018. This reveals a severe carpal tunnel syndrome bilaterally. With chronic and active denervation the motor and sensory response on the right side are absent. His left wrist shows a severe left carpal tunnel syndrome has no evidence of myopathy. A motor delay of 6.6 on his left a sensory delay of 5.0 with no response to either motor or sensory component on his right side. He has no history of diabetes thyroid problems or arthritis. He does have a history of gout. Family history is positive arthritis negative for diabetes thyroid problems and gout. He is not complaining of any significant pain he is complaining only of numbness and tingling. He has no history of injury to his neck .  He also has a mass on the base of his thumb thenar eminence which he would like to have removed.  He states this is been gradually enlarging.  He has a history of gout and this may be an enlarging gouty tophus.   Past Medical History:  Diagnosis Date  . Gout   . History of kidney stones    one kidney stone, 19 yrs ago  . Hypertension      Past Surgical History:  Procedure Laterality Date  . TONSILLECTOMY      History reviewed. No pertinent family history. Social History:  reports that he has been smoking cigarettes. He has never used smokeless tobacco. His alcohol and drug histories are not on file.  Allergies: No Known Allergies  No medications prior to admission.    No results found for this or any previous visit (from the past 48 hour(s)).  No results found.   Pertinent items are noted in HPI.  Height 5\' 8"  (1.727 m), weight 90.7 kg.  General appearance: alert, cooperative and appears stated age Head: Normocephalic, without obvious abnormality Neck: no JVD Resp: clear to auscultation bilaterally Cardio: regular rate and rhythm, S1, S2 normal, no murmur, click, rub or gallop GI: soft, non-tender; bowel sounds normal; no masses,  no organomegaly Extremities: In his right hand with mass right palm area. Pulses: 2+ and symmetric Skin: Skin color, texture, turgor normal. No rashes or lesions Neurologic: Grossly normal Incision/Wound: na  Assessment/Plan Assessment:  1. Numbness 2. Bilateral carpal tunnel syndrome  3. Scapholunate advanced collapse of right wrist  4. Idiopathic chronic gout of multiple sites with tophus  5. Primary osteoarthritis of both hands #6 mass right hand   Plan: Have discussed his nerve conductions with him. The notes from Dr. Felipa Eth and Dr. Domingo Cocking are reviewed. We have discussed possibility of surgical  decompression of the median nerves. He is aware there is no guarantee to the surgery the possibility of infection recurrence injury to arteries nerves tendons complete relief symptoms dystrophy. Would not recommend any surgical intervention to the arthritis and that he is not complaining of arthritis pain on either side. Would like to proceed. He has several golf tournaments coming up which she would like to plan we will schedule him for left carpal tunnel release and excision  mass as an outpatient under regional anesthesia.   Daryll Brod 09/14/2018, 5:47 AM

## 2018-09-14 NOTE — Transfer of Care (Signed)
Immediate Anesthesia Transfer of Care Note  Patient: David Snow  Procedure(s) Performed: CARPAL TUNNEL RELEASE (Right Wrist)  Patient Location: PACU  Anesthesia Type:MAC and Bier block  Level of Consciousness: awake, alert  and oriented  Airway & Oxygen Therapy: Patient Spontanous Breathing and Patient connected to face mask oxygen  Post-op Assessment: Report given to RN and Post -op Vital signs reviewed and stable  Post vital signs: Reviewed and stable  Last Vitals:  Vitals Value Taken Time  BP    Temp    Pulse    Resp    SpO2      Last Pain:  Vitals:   09/14/18 0909  TempSrc: Oral  PainSc: 0-No pain      Patients Stated Pain Goal: 3 (09/81/19 1478)  Complications: No apparent anesthesia complications

## 2018-09-14 NOTE — Brief Op Note (Signed)
09/14/2018  11:08 AM  PATIENT:  David Snow  67 y.o. male  PRE-OPERATIVE DIAGNOSIS:  RIGHT CARPAL TUNNEL SYNDROME  POST-OPERATIVE DIAGNOSIS:  RIGHT CARPAL TUNNEL SYNDROME  PROCEDURE:  Procedure(s): CARPAL TUNNEL RELEASE (Right)  SURGEON:  Surgeon(s) and Role:    * Daryll Brod, MD - Primary  PHYSICIAN ASSISTANT:   ASSISTANTS: none   ANESTHESIA:   local, regional and IV sedation  EBL:  5 mL   BLOOD ADMINISTERED:none  DRAINS: none   LOCAL MEDICATIONS USED:  BUPIVICAINE   SPECIMEN:  Excision  DISPOSITION OF SPECIMEN:  PATHOLOGY  COUNTS:  YES  TOURNIQUET:  * Missing tourniquet times found for documented tourniquets in log: [id number] *  DICTATION: .Dragon Dictation  PLAN OF CARE: Discharge to home after PACU  PATIENT DISPOSITION:  PACU - hemodynamically stable.

## 2018-09-14 NOTE — Anesthesia Postprocedure Evaluation (Signed)
Anesthesia Post Note  Patient: David Snow  Procedure(s) Performed: CARPAL TUNNEL RELEASE (Right Wrist)     Patient location during evaluation: PACU Anesthesia Type: MAC Level of consciousness: awake and alert Pain management: pain level controlled Vital Signs Assessment: post-procedure vital signs reviewed and stable Respiratory status: spontaneous breathing, nonlabored ventilation, respiratory function stable and patient connected to nasal cannula oxygen Cardiovascular status: stable and blood pressure returned to baseline Postop Assessment: no apparent nausea or vomiting Anesthetic complications: no    Last Vitals:  Vitals:   09/14/18 1143 09/14/18 1200  BP: (!) 154/92 (!) 157/90  Pulse: (!) 54 (!) 59  Resp: 18 16  Temp:  36.6 C  SpO2: 97% 96%    Last Pain:  Vitals:   09/14/18 1200  TempSrc:   PainSc: 0-No pain                 Adriyanna Christians L Roran Wegner

## 2018-09-15 ENCOUNTER — Encounter (HOSPITAL_BASED_OUTPATIENT_CLINIC_OR_DEPARTMENT_OTHER): Payer: Self-pay | Admitting: Orthopedic Surgery

## 2018-10-20 DIAGNOSIS — L84 Corns and callosities: Secondary | ICD-10-CM | POA: Diagnosis not present

## 2018-10-20 DIAGNOSIS — Z79899 Other long term (current) drug therapy: Secondary | ICD-10-CM | POA: Diagnosis not present

## 2018-10-20 DIAGNOSIS — I10 Essential (primary) hypertension: Secondary | ICD-10-CM | POA: Diagnosis not present

## 2018-10-20 DIAGNOSIS — M549 Dorsalgia, unspecified: Secondary | ICD-10-CM | POA: Diagnosis not present

## 2018-10-20 DIAGNOSIS — M1A9XX1 Chronic gout, unspecified, with tophus (tophi): Secondary | ICD-10-CM | POA: Diagnosis not present

## 2018-10-20 DIAGNOSIS — Z23 Encounter for immunization: Secondary | ICD-10-CM | POA: Diagnosis not present

## 2018-10-28 ENCOUNTER — Ambulatory Visit (INDEPENDENT_AMBULATORY_CARE_PROVIDER_SITE_OTHER): Payer: Medicare Other | Admitting: Podiatry

## 2018-10-28 ENCOUNTER — Ambulatory Visit (INDEPENDENT_AMBULATORY_CARE_PROVIDER_SITE_OTHER): Payer: Medicare Other

## 2018-10-28 DIAGNOSIS — M79675 Pain in left toe(s): Secondary | ICD-10-CM

## 2018-10-28 DIAGNOSIS — M205X1 Other deformities of toe(s) (acquired), right foot: Secondary | ICD-10-CM

## 2018-10-28 DIAGNOSIS — L84 Corns and callosities: Secondary | ICD-10-CM

## 2018-10-28 DIAGNOSIS — M779 Enthesopathy, unspecified: Secondary | ICD-10-CM | POA: Diagnosis not present

## 2018-10-28 DIAGNOSIS — G8929 Other chronic pain: Secondary | ICD-10-CM

## 2018-10-28 DIAGNOSIS — B351 Tinea unguium: Secondary | ICD-10-CM | POA: Diagnosis not present

## 2018-10-28 DIAGNOSIS — L600 Ingrowing nail: Secondary | ICD-10-CM | POA: Diagnosis not present

## 2018-10-28 DIAGNOSIS — M79672 Pain in left foot: Secondary | ICD-10-CM

## 2018-10-28 DIAGNOSIS — M79671 Pain in right foot: Secondary | ICD-10-CM

## 2018-10-29 ENCOUNTER — Other Ambulatory Visit: Payer: Self-pay | Admitting: Podiatry

## 2018-10-29 DIAGNOSIS — M779 Enthesopathy, unspecified: Secondary | ICD-10-CM

## 2018-10-29 NOTE — Progress Notes (Signed)
Subjective:   Patient ID: David Snow, male   DOB: 67 y.o.   MRN: [id number]   HPI 67 year old male presents the office today for concerns of pain to his right fifth toe when she has a corn he also has some pain on the left hallux toenail which is been ongoing the last 10 days.  The pain in the right fifth toe has been ongoing for some time and is more painful with shoes and pressure.  He said no recent trauma.  Denies any redness or drainage or any swelling to the toenail sites.  He also states his nails are thickened discolored he wants to discuss laser therapy for nail fungus.  He has no other concerns today.  Review of Systems  All other systems reviewed and are negative.  Past Medical History:  Diagnosis Date  . Gout   . History of kidney stones    one kidney stone, 19 yrs ago  . Hypertension     Past Surgical History:  Procedure Laterality Date  . CARPAL TUNNEL RELEASE Right 09/14/2018   Procedure: CARPAL TUNNEL RELEASE;  Surgeon: Daryll Brod, MD;  Location: [facility];  Service: Orthopedics;  Laterality: Right;  . TONSILLECTOMY       Current Outpatient Medications:  .  allopurinol (ZYLOPRIM) 300 MG tablet, Take 300 mg by mouth daily., Disp: , Rfl:  .  ramipril (ALTACE) 10 MG capsule, Take 10 mg by mouth daily., Disp: , Rfl:  .  sulfamethoxazole-trimethoprim (BACTRIM DS,SEPTRA DS) 800-160 MG tablet, Take 1 tablet by mouth 2 (two) times daily. for 10 days, Disp: , Rfl: 0 .  HYDROcodone-acetaminophen (NORCO) 5-325 MG tablet, Take 1 tablet by mouth every 6 (six) hours as needed. (Patient not taking: Reported on 10/28/2018), Disp: 20 tablet, Rfl: 0 .  ibuprofen (ADVIL,MOTRIN) 400 MG tablet, Take 400 mg by mouth every 6 (six) hours as needed for moderate pain., Disp: , Rfl:   No Known Allergies        Objective:  Physical Exam  General: AAO x3, NAD  Dermatological: Hyperkeratotic lesion dorsal aspect right fifth toe on the PIPJ without any underlying  ulceration, drainage or any clinical signs of infection noted today.  Nails are hypertrophic, dystrophic and discolored with yellow to brown discoloration.  On the medial aspect the left hallux toes there is incurvation of the nail distally there is some discomfort but there is no signs of infection.  No open lesions.  Vascular: Dorsalis Pedis artery and Posterior Tibial artery pedal pulses are 2/4 bilateral with immedate capillary fill time. There is no pain with calf compression, swelling, warmth, erythema.   Neruologic: Grossly intact via light touch bilateral. . Protective threshold with Semmes Wienstein monofilament intact to all pedal sites bilateral.   Musculoskeletal: Adductovarus present of the fifth toes.  Tenderness the left hallux toenail medial corner distally but no other areas of tenderness.  Muscular strength 5/5 in all groups tested bilateral.  Gait: Unassisted, Nonantalgic.       Assessment:  Hyperkeratotic lesion right fifth toe, onychomycosis with left hallux ingrown toenail     Plan:  -Treatment options discussed including all alternatives, risks, and complications -Etiology of symptoms were discussed -X-rays obtained reviewed of the left and right foot.  No evidence of acute fracture or stress fracture or significant bone spur formation. -I sharply debrided the hyperkeratotic lesion on the right fifth toe without any complications or bleeding.  Dispensed offloading pads. -I sharply debrided the left hallux toenail any  complications or bleeding after debridement there is resolution of pain.  We discussed treatment options for nail fungus he was to proceed with laser therapy which we will reappoint him next week to start this.  Also I dispensed formula 3.  This is not a guarantee resolution of symptoms we discussed side effects, success rates.  Trula Slade DPM

## 2018-11-10 ENCOUNTER — Ambulatory Visit: Payer: Self-pay

## 2018-11-10 DIAGNOSIS — B351 Tinea unguium: Secondary | ICD-10-CM

## 2018-11-15 NOTE — Progress Notes (Signed)
Pt presents with mycotic infection of nails 1-5 bilateral.  All other systems are negative  Laser therapy administered to affected nails and tolerated well. All safety precautions were in place.  2nd treatment.  Follow up in 4 weeks     

## 2018-12-20 ENCOUNTER — Ambulatory Visit: Payer: Self-pay

## 2018-12-20 DIAGNOSIS — B351 Tinea unguium: Secondary | ICD-10-CM

## 2018-12-28 NOTE — Progress Notes (Signed)
Pt presents with mycotic infection of nails 1-5 bilateral.  All other systems are negative  Laser therapy administered to affected nails and tolerated well. All safety precautions were in place.  3rd treatment.  Follow up in 4 weeks     

## 2019-01-17 ENCOUNTER — Ambulatory Visit: Payer: Self-pay

## 2019-01-17 DIAGNOSIS — B351 Tinea unguium: Secondary | ICD-10-CM

## 2019-01-18 NOTE — Progress Notes (Signed)
Pt presents with mycotic infection of nails 1-5 bilateral  All other systems are negative  Laser therapy administered to affected nails and tolerated well. All safety precautions were in place. 4th treatment.  Follow up in 4 weeks     

## 2019-01-25 DIAGNOSIS — F5101 Primary insomnia: Secondary | ICD-10-CM | POA: Diagnosis not present

## 2019-01-25 DIAGNOSIS — F17210 Nicotine dependence, cigarettes, uncomplicated: Secondary | ICD-10-CM | POA: Diagnosis not present

## 2019-01-25 DIAGNOSIS — F4321 Adjustment disorder with depressed mood: Secondary | ICD-10-CM | POA: Diagnosis not present

## 2019-01-25 DIAGNOSIS — I10 Essential (primary) hypertension: Secondary | ICD-10-CM | POA: Diagnosis not present

## 2019-01-25 DIAGNOSIS — R0981 Nasal congestion: Secondary | ICD-10-CM | POA: Diagnosis not present

## 2019-02-14 ENCOUNTER — Ambulatory Visit: Payer: Self-pay

## 2019-02-14 DIAGNOSIS — B351 Tinea unguium: Secondary | ICD-10-CM

## 2019-02-21 NOTE — Progress Notes (Signed)
Pt presents with mycotic infection of nails 1-5 bilateral All other systems are negative  Laser therapy administered to affected nails and tolerated well. All safety precautions were in place. 5th treatment.  Follow up in 4 weeks     

## 2019-02-28 DIAGNOSIS — E782 Mixed hyperlipidemia: Secondary | ICD-10-CM | POA: Diagnosis not present

## 2019-02-28 DIAGNOSIS — I700 Atherosclerosis of aorta: Secondary | ICD-10-CM | POA: Diagnosis not present

## 2019-02-28 DIAGNOSIS — I10 Essential (primary) hypertension: Secondary | ICD-10-CM | POA: Diagnosis not present

## 2019-02-28 DIAGNOSIS — Z79899 Other long term (current) drug therapy: Secondary | ICD-10-CM | POA: Diagnosis not present

## 2019-03-01 DIAGNOSIS — E782 Mixed hyperlipidemia: Secondary | ICD-10-CM | POA: Diagnosis not present

## 2019-03-01 DIAGNOSIS — Z79899 Other long term (current) drug therapy: Secondary | ICD-10-CM | POA: Diagnosis not present

## 2019-03-21 ENCOUNTER — Other Ambulatory Visit: Payer: Medicare Other

## 2019-04-04 ENCOUNTER — Other Ambulatory Visit: Payer: Medicare Other

## 2019-04-08 ENCOUNTER — Other Ambulatory Visit: Payer: Self-pay

## 2019-04-08 ENCOUNTER — Ambulatory Visit: Payer: Self-pay

## 2019-04-08 DIAGNOSIS — B351 Tinea unguium: Secondary | ICD-10-CM

## 2019-04-08 NOTE — Progress Notes (Signed)
Pt presents with mycotic infection of nails 1-5 bilateral  All other systems are negative  Laser therapy administered to affected nails and tolerated well. All safety precautions were in place. 6th treatment.  Follow up in 4 weeks

## 2019-04-13 DIAGNOSIS — E782 Mixed hyperlipidemia: Secondary | ICD-10-CM | POA: Diagnosis not present

## 2019-04-13 DIAGNOSIS — I10 Essential (primary) hypertension: Secondary | ICD-10-CM | POA: Diagnosis not present

## 2019-04-13 DIAGNOSIS — F17210 Nicotine dependence, cigarettes, uncomplicated: Secondary | ICD-10-CM | POA: Diagnosis not present

## 2019-05-13 ENCOUNTER — Other Ambulatory Visit: Payer: Self-pay

## 2019-05-13 ENCOUNTER — Other Ambulatory Visit: Payer: Self-pay | Admitting: Acute Care

## 2019-05-13 ENCOUNTER — Ambulatory Visit (INDEPENDENT_AMBULATORY_CARE_PROVIDER_SITE_OTHER): Payer: Self-pay

## 2019-05-13 DIAGNOSIS — B351 Tinea unguium: Secondary | ICD-10-CM

## 2019-05-13 DIAGNOSIS — Z122 Encounter for screening for malignant neoplasm of respiratory organs: Secondary | ICD-10-CM

## 2019-05-13 DIAGNOSIS — F17210 Nicotine dependence, cigarettes, uncomplicated: Secondary | ICD-10-CM

## 2019-05-13 DIAGNOSIS — Z87891 Personal history of nicotine dependence: Secondary | ICD-10-CM

## 2019-05-13 NOTE — Progress Notes (Signed)
Pt presents with mycotic infection of nails 1-5 bilateral  All other systems are negative  Laser therapy administered to affected nails and tolerated well. All safety precautions were in place. 7th treatment.  Follow up in 4 weeks

## 2019-06-08 ENCOUNTER — Ambulatory Visit: Payer: Medicare Other

## 2019-06-08 ENCOUNTER — Ambulatory Visit
Admission: RE | Admit: 2019-06-08 | Discharge: 2019-06-08 | Disposition: A | Discharge status: Home / Self Care | Payer: Medicare Other | Source: Ambulatory Visit | Attending: Acute Care | Admitting: Acute Care

## 2019-06-08 ENCOUNTER — Other Ambulatory Visit: Payer: Self-pay

## 2019-06-08 DIAGNOSIS — Z122 Encounter for screening for malignant neoplasm of respiratory organs: Secondary | ICD-10-CM

## 2019-06-08 DIAGNOSIS — Z87891 Personal history of nicotine dependence: Secondary | ICD-10-CM

## 2019-06-08 DIAGNOSIS — F17210 Nicotine dependence, cigarettes, uncomplicated: Secondary | ICD-10-CM

## 2019-06-13 ENCOUNTER — Ambulatory Visit: Payer: Medicare Other

## 2019-06-14 ENCOUNTER — Other Ambulatory Visit: Payer: Self-pay | Admitting: *Deleted

## 2019-06-14 DIAGNOSIS — Z87891 Personal history of nicotine dependence: Secondary | ICD-10-CM

## 2019-06-14 DIAGNOSIS — F17210 Nicotine dependence, cigarettes, uncomplicated: Secondary | ICD-10-CM

## 2019-06-14 DIAGNOSIS — Z122 Encounter for screening for malignant neoplasm of respiratory organs: Secondary | ICD-10-CM

## 2019-06-20 ENCOUNTER — Ambulatory Visit (INDEPENDENT_AMBULATORY_CARE_PROVIDER_SITE_OTHER): Payer: Medicare Other | Admitting: Podiatry

## 2019-06-20 ENCOUNTER — Other Ambulatory Visit: Payer: Self-pay

## 2019-06-20 DIAGNOSIS — B351 Tinea unguium: Secondary | ICD-10-CM

## 2019-07-12 NOTE — Progress Notes (Signed)
Pt presents with mycotic infection of nails 1-5 bilateral  All other systems are negative  Laser therapy administered to affected nails and tolerated well. All safety precautions were in place.  Follow up prn    

## 2019-07-20 ENCOUNTER — Ambulatory Visit: Payer: Self-pay | Admitting: Podiatry

## 2019-07-20 ENCOUNTER — Other Ambulatory Visit: Payer: Self-pay

## 2019-07-20 DIAGNOSIS — B351 Tinea unguium: Secondary | ICD-10-CM

## 2019-08-04 DIAGNOSIS — F17210 Nicotine dependence, cigarettes, uncomplicated: Secondary | ICD-10-CM | POA: Diagnosis not present

## 2019-08-04 DIAGNOSIS — I10 Essential (primary) hypertension: Secondary | ICD-10-CM | POA: Diagnosis not present

## 2019-08-04 DIAGNOSIS — Z79899 Other long term (current) drug therapy: Secondary | ICD-10-CM | POA: Diagnosis not present

## 2019-08-04 DIAGNOSIS — Z1389 Encounter for screening for other disorder: Secondary | ICD-10-CM | POA: Diagnosis not present

## 2019-08-19 NOTE — Progress Notes (Signed)
Pt presents with mycotic infection of nails 1-5 bilateral  All other systems are negative  Laser therapy administered to affected nails and tolerated well. All safety precautions were in place.  Follow up prn    

## 2019-09-21 ENCOUNTER — Other Ambulatory Visit: Payer: Medicare Other

## 2019-10-15 DIAGNOSIS — Z23 Encounter for immunization: Secondary | ICD-10-CM | POA: Diagnosis not present

## 2020-02-04 ENCOUNTER — Ambulatory Visit: Payer: Medicare Other | Attending: Internal Medicine

## 2020-02-04 DIAGNOSIS — Z23 Encounter for immunization: Secondary | ICD-10-CM | POA: Insufficient documentation

## 2020-02-04 NOTE — Progress Notes (Signed)
   Covid-19 Vaccination Clinic  Name:  David Snow    MRN: [id number] DOB: July 10, 1951  02/04/2020  [name] was observed post Covid-19 immunization for 15 minutes without incidence. He was provided with Vaccine Information Sheet and instruction to access the V-Safe system.   [name] was instructed to call 911 with any severe reactions post vaccine: Marland Kitchen Difficulty breathing  . Swelling of your face and throat  . A fast heartbeat  . A bad rash all over your body  . Dizziness and weakness    Immunizations Administered    Name Date Dose VIS Date Route   Pfizer COVID-19 Vaccine 02/04/2020  1:53 PM 0.3 mL 11/25/2019 Intramuscular   Manufacturer: [place]   Lot: [id number]   [place]: [id number]

## 2020-02-27 DIAGNOSIS — Z1389 Encounter for screening for other disorder: Secondary | ICD-10-CM | POA: Diagnosis not present

## 2020-02-27 DIAGNOSIS — I700 Atherosclerosis of aorta: Secondary | ICD-10-CM | POA: Diagnosis not present

## 2020-02-27 DIAGNOSIS — Z1159 Encounter for screening for other viral diseases: Secondary | ICD-10-CM | POA: Diagnosis not present

## 2020-02-27 DIAGNOSIS — I10 Essential (primary) hypertension: Secondary | ICD-10-CM | POA: Diagnosis not present

## 2020-02-27 DIAGNOSIS — Z79899 Other long term (current) drug therapy: Secondary | ICD-10-CM | POA: Diagnosis not present

## 2020-02-27 DIAGNOSIS — F17210 Nicotine dependence, cigarettes, uncomplicated: Secondary | ICD-10-CM | POA: Diagnosis not present

## 2020-02-27 DIAGNOSIS — D692 Other nonthrombocytopenic purpura: Secondary | ICD-10-CM | POA: Diagnosis not present

## 2020-02-27 DIAGNOSIS — E782 Mixed hyperlipidemia: Secondary | ICD-10-CM | POA: Diagnosis not present

## 2020-02-27 DIAGNOSIS — Z0000 Encounter for general adult medical examination without abnormal findings: Secondary | ICD-10-CM | POA: Diagnosis not present

## 2020-02-29 ENCOUNTER — Ambulatory Visit: Payer: Medicare Other | Attending: Internal Medicine

## 2020-02-29 DIAGNOSIS — Z23 Encounter for immunization: Secondary | ICD-10-CM

## 2020-02-29 NOTE — Progress Notes (Signed)
   Covid-19 Vaccination Clinic  Name:  [name]    MRN: [id number] DOB: 06-Feb-1951  02/29/2020  [name] was observed post Covid-19 immunization for 15 minutes without incident. He was provided with Vaccine Information Sheet and instruction to access the V-Safe system.   [name] was instructed to call 911 with any severe reactions post vaccine: Marland Kitchen Difficulty breathing  . Swelling of face and throat  . A fast heartbeat  . A bad rash all over body  . Dizziness and weakness   Immunizations Administered    Name Date Dose VIS Date Route   Pfizer COVID-19 Vaccine 02/29/2020  8:15 AM 0.3 mL 11/25/2019 Intramuscular   Manufacturer: [place]   Lot: [id number]   [place]: [id number]

## 2020-05-04 DIAGNOSIS — I10 Essential (primary) hypertension: Secondary | ICD-10-CM | POA: Diagnosis not present

## 2020-05-04 DIAGNOSIS — E782 Mixed hyperlipidemia: Secondary | ICD-10-CM | POA: Diagnosis not present

## 2020-05-28 DIAGNOSIS — E782 Mixed hyperlipidemia: Secondary | ICD-10-CM | POA: Diagnosis not present

## 2020-05-28 DIAGNOSIS — Z79899 Other long term (current) drug therapy: Secondary | ICD-10-CM | POA: Diagnosis not present

## 2020-05-30 ENCOUNTER — Telehealth: Payer: Self-pay | Admitting: Acute Care

## 2020-05-31 NOTE — Telephone Encounter (Signed)
We have been screening the patient since 2017. The orders have all been placed by this program. If they would like to send documentation in writing that they will be assuming full responsibility for all lung cancer screening, call reporting and follow up, we will remove the patient from our program. We always need to see a documented continuum of care, so patient's do not fall through the cracks.  David Snow, can you please make sure this gets addressed. Thanks

## 2020-05-31 NOTE — Telephone Encounter (Signed)
Routed to [place] for follow up.

## 2020-06-01 NOTE — Telephone Encounter (Signed)
[place] x 1 for Tammy at Dr Carlyle Lipa office

## 2020-06-01 NOTE — Telephone Encounter (Signed)
I spoke with David Snow at Dr Carlyle Lipa office.  She states that Dr Felipa Eth want to continue following pt for lung screening.  Current CT order is scheduled for 06/12/2020. If results come to Eric Form, NP I will make sure and forward results to Dr Felipa Eth and make sure that they place the order for the 2022 low dose CT. Nothing further needed at this time.

## 2020-06-12 ENCOUNTER — Other Ambulatory Visit: Payer: Self-pay

## 2020-06-12 ENCOUNTER — Ambulatory Visit
Admission: RE | Admit: 2020-06-12 | Discharge: 2020-06-12 | Disposition: A | Discharge status: Home / Self Care | Payer: Medicare Other | Source: Ambulatory Visit | Attending: Acute Care | Admitting: Acute Care

## 2020-06-12 DIAGNOSIS — F17210 Nicotine dependence, cigarettes, uncomplicated: Secondary | ICD-10-CM | POA: Diagnosis not present

## 2020-06-12 DIAGNOSIS — Z87891 Personal history of nicotine dependence: Secondary | ICD-10-CM

## 2020-06-12 DIAGNOSIS — Z122 Encounter for screening for malignant neoplasm of respiratory organs: Secondary | ICD-10-CM

## 2020-06-14 NOTE — Progress Notes (Signed)
Please call patient and let them  know their  low dose Ct was read as a Lung RADS 2: nodules that are benign in appearance and behavior with a very low likelihood of becoming a clinically active cancer due to size or lack of growth. Recommendation per radiology is for a repeat LDCT in 12 months. Please let them  know we will order and schedule their  annual screening scan for 05/2021. Please let them  know there was notation of CAD on their  scan.  Please remind the patient  that this is a non-gated exam therefore degree or severity of disease  cannot be determined. Please have them  follow up with their PCP regarding potential risk factor modification, dietary therapy or pharmacologic therapy if clinically indicated. Pt.  is not currently on statin therapy. Please place order for annual  screening scan for  05/2021 and fax results to PCP. Thanks so much.  Langley Gauss, please make sure he follows up with PCP about the CAD findings. Thanks so much

## 2020-06-26 DIAGNOSIS — H31093 Other chorioretinal scars, bilateral: Secondary | ICD-10-CM | POA: Diagnosis not present

## 2020-06-26 DIAGNOSIS — H18513 Endothelial corneal dystrophy, bilateral: Secondary | ICD-10-CM | POA: Diagnosis not present

## 2020-06-26 DIAGNOSIS — H02834 Dermatochalasis of left upper eyelid: Secondary | ICD-10-CM | POA: Diagnosis not present

## 2020-06-26 DIAGNOSIS — H2513 Age-related nuclear cataract, bilateral: Secondary | ICD-10-CM | POA: Diagnosis not present

## 2020-06-26 DIAGNOSIS — H02831 Dermatochalasis of right upper eyelid: Secondary | ICD-10-CM | POA: Diagnosis not present

## 2020-08-29 DIAGNOSIS — I10 Essential (primary) hypertension: Secondary | ICD-10-CM | POA: Diagnosis not present

## 2020-08-29 DIAGNOSIS — Z23 Encounter for immunization: Secondary | ICD-10-CM | POA: Diagnosis not present

## 2020-08-29 DIAGNOSIS — Z79899 Other long term (current) drug therapy: Secondary | ICD-10-CM | POA: Diagnosis not present

## 2020-08-29 DIAGNOSIS — E782 Mixed hyperlipidemia: Secondary | ICD-10-CM | POA: Diagnosis not present

## 2020-08-29 DIAGNOSIS — F17210 Nicotine dependence, cigarettes, uncomplicated: Secondary | ICD-10-CM | POA: Diagnosis not present

## 2020-09-10 DIAGNOSIS — E782 Mixed hyperlipidemia: Secondary | ICD-10-CM | POA: Diagnosis not present

## 2020-09-10 DIAGNOSIS — I10 Essential (primary) hypertension: Secondary | ICD-10-CM | POA: Diagnosis not present

## 2020-09-13 DIAGNOSIS — H2512 Age-related nuclear cataract, left eye: Secondary | ICD-10-CM | POA: Diagnosis not present

## 2020-10-18 IMAGING — CT CT CHEST LUNG CANCER SCREENING LOW DOSE W/O CM
1 series · 10 of 10 positions shown, 13 images · non-contrast
Comparison: Chest CT 06/08/2019.

CLINICAL DATA: 69-year-old male current smoker with 50 pack-year
history of smoking.

EXAM:
CT CHEST WITHOUT CONTRAST LOW-DOSE FOR LUNG CANCER SCREENING
TECHNIQUE: Multidetector CT imaging of the chest was performed following the
standard protocol without IV contrast.

[ct lung segmentation data · axial · 0.78mm/px · z∈[+787,+787]mm · 10 of 334 frames shown]
[frame 1/334  mediastinal]
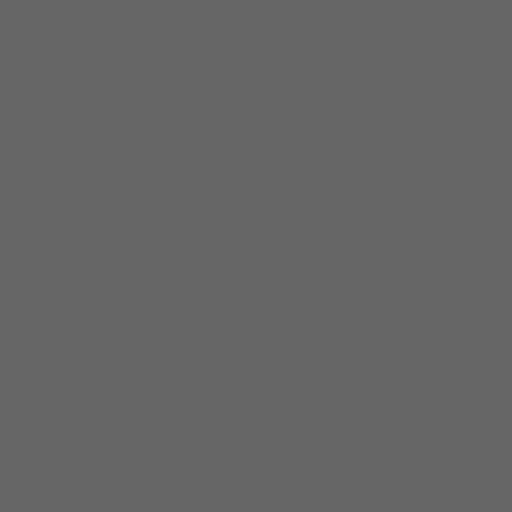
[frame 1/334  lung]
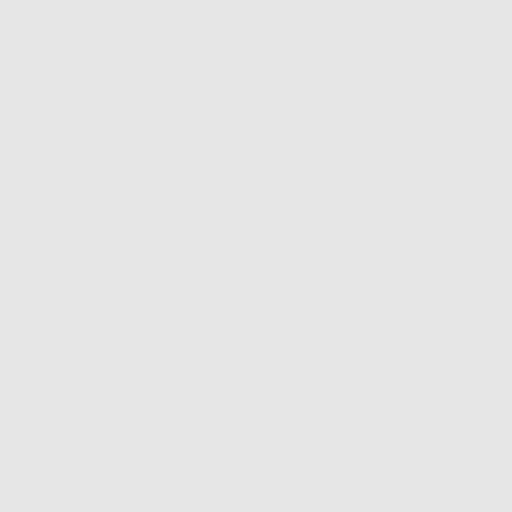
[frame 38/334  lung]
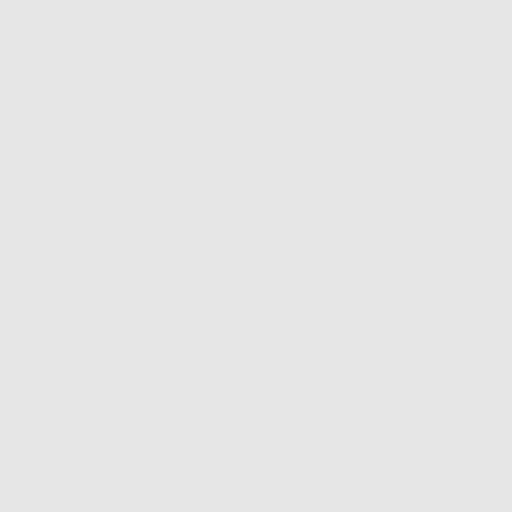
[frame 75/334  lung]
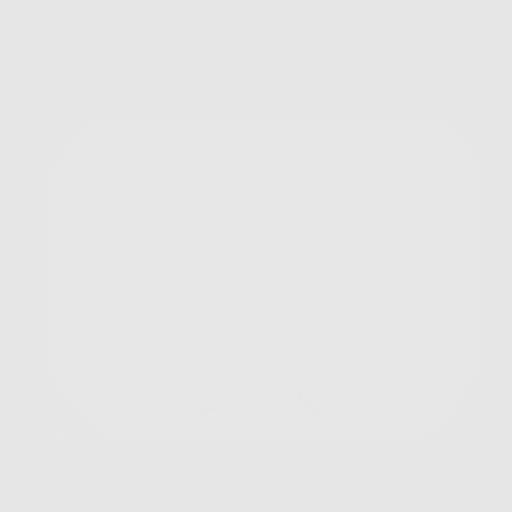
[frame 112/334  lung]
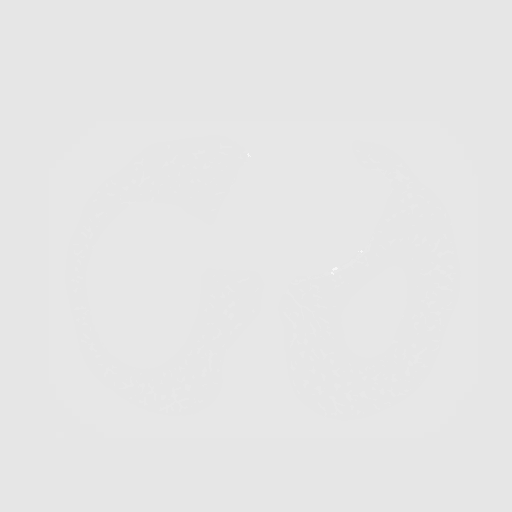
[frame 149/334  mediastinal]
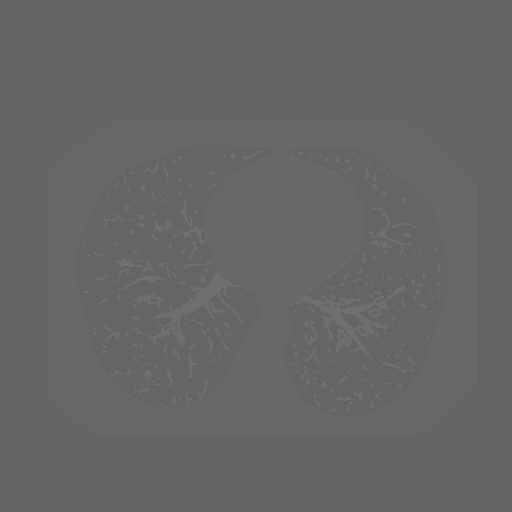
[frame 149/334  lung]
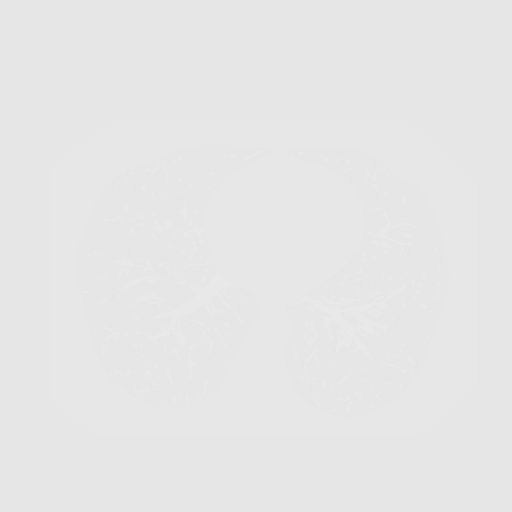
[frame 186/334  lung]
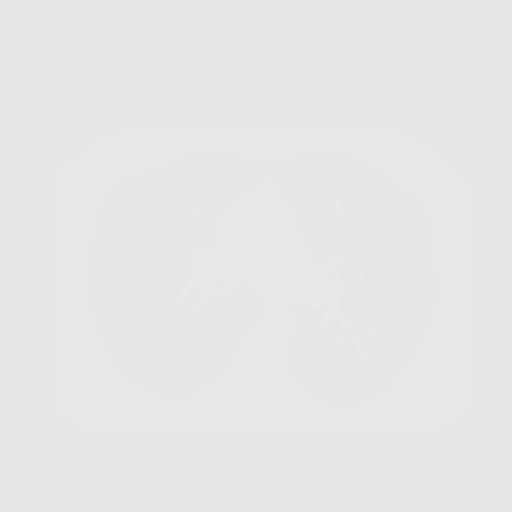
[frame 223/334  lung]
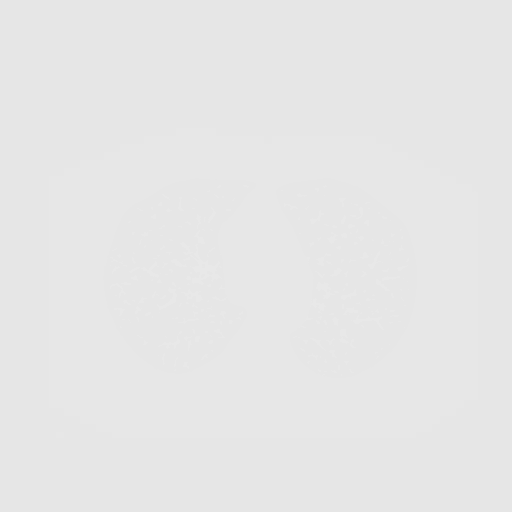
[frame 260/334  lung]
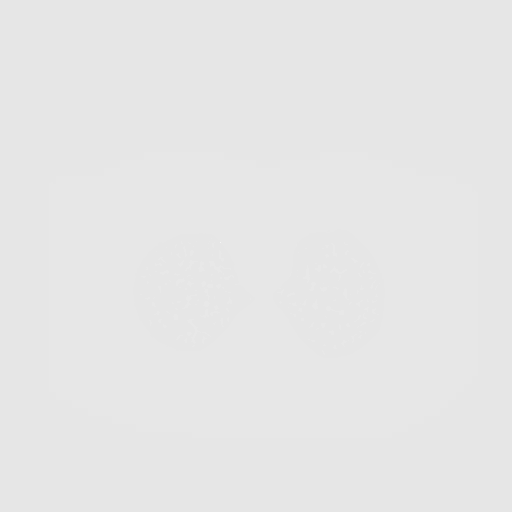
[frame 297/334  mediastinal]
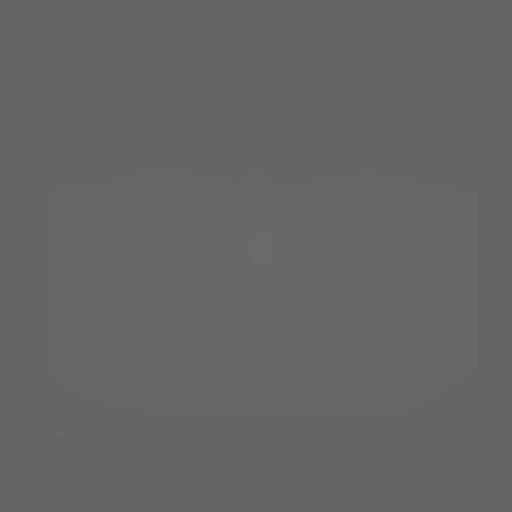
[frame 297/334  lung]
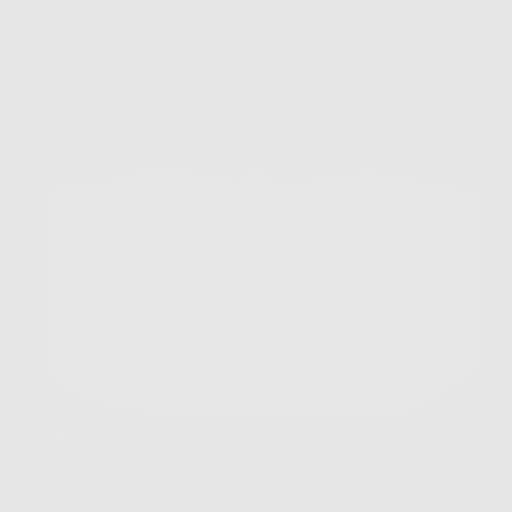
[frame 334/334  lung]
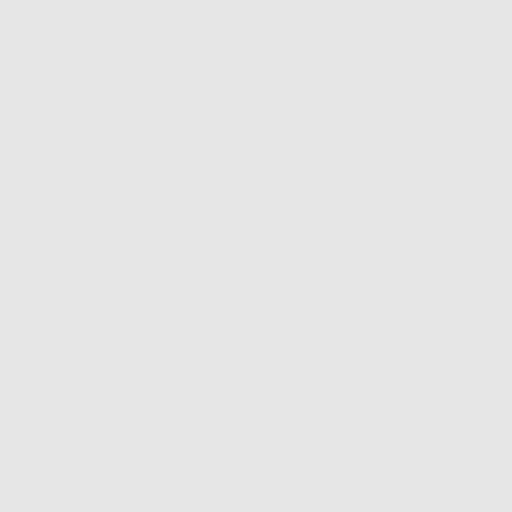

[10 of 10 positions shown; findings below may reference images not displayed]

FINDINGS: Cardiovascular: Heart size is normal. There is no significant
pericardial fluid, thickening or pericardial calcification. There is
aortic atherosclerosis, as well as atherosclerosis of the great
vessels of the mediastinum and the coronary arteries, including
calcified atherosclerotic plaque in the left anterior descending and
right coronary arteries. Calcifications of the aortic valve.

Mediastinum/Nodes: No pathologically enlarged mediastinal or hilar
lymph nodes. Please note that accurate exclusion of hilar adenopathy
is limited on noncontrast CT scans. Esophagus is unremarkable in
appearance. No axillary lymphadenopathy.

Lungs/Pleura: Multiple small pulmonary nodules are again noted
throughout the lungs bilaterally, largest of which is in the
inferior segment of the lingula (axial image 224 of series 3), with
a volume derived mean diameter of 3.7 mm, similar to the prior
study. No other larger more suspicious appearing pulmonary nodules
or masses are noted. No acute consolidative airspace disease. No
pleural effusions. Diffuse bronchial wall thickening with very mild
centrilobular and paraseptal emphysema.

Upper Abdomen: [DATE] x 1.9 cm low-attenuation (0 Hounsfield unit)
right adrenal nodule, compatible with an adrenal adenoma.

Musculoskeletal: There are no aggressive appearing lytic or blastic
lesions noted in the visualized portions of the skeleton.
IMPRESSION: 1. Lung-RADS 2S, benign appearance or behavior. Continue annual
screening with low-dose chest CT without contrast in 12 months.
2. The "S" modifier above refers to potentially clinically
significant non lung cancer related findings. Specifically, there is
aortic atherosclerosis, in addition to 2 vessel coronary artery
disease. Please note that although the presence of coronary artery
calcium documents the presence of coronary artery disease, the
severity of this disease and any potential stenosis cannot be
assessed on this non-gated CT examination. Assessment for potential
risk factor modification, dietary therapy or pharmacologic therapy
may be warranted, if clinically indicated.
3. Mild diffuse bronchial wall thickening with very mild
centrilobular and paraseptal emphysema; imaging findings suggestive
of underlying COPD.
4. There are calcifications of the aortic valve. Echocardiographic
correlation for evaluation of potential valvular dysfunction may be
warranted if clinically indicated.
5. 3.0 x 1.9 cm right adrenal adenoma.

Aortic Atherosclerosis (Z2GG7-LLC.C) and Emphysema (Z2GG7-JY8.8).

## 2020-11-06 DIAGNOSIS — I10 Essential (primary) hypertension: Secondary | ICD-10-CM | POA: Diagnosis not present

## 2020-11-06 DIAGNOSIS — E782 Mixed hyperlipidemia: Secondary | ICD-10-CM | POA: Diagnosis not present

## 2020-11-13 DIAGNOSIS — Z23 Encounter for immunization: Secondary | ICD-10-CM | POA: Diagnosis not present

## 2021-02-05 DIAGNOSIS — E782 Mixed hyperlipidemia: Secondary | ICD-10-CM | POA: Diagnosis not present

## 2021-02-05 DIAGNOSIS — I10 Essential (primary) hypertension: Secondary | ICD-10-CM | POA: Diagnosis not present

## 2021-02-25 DIAGNOSIS — E782 Mixed hyperlipidemia: Secondary | ICD-10-CM | POA: Diagnosis not present

## 2021-02-25 DIAGNOSIS — I10 Essential (primary) hypertension: Secondary | ICD-10-CM | POA: Diagnosis not present

## 2021-02-27 DIAGNOSIS — L989 Disorder of the skin and subcutaneous tissue, unspecified: Secondary | ICD-10-CM | POA: Diagnosis not present

## 2021-02-27 DIAGNOSIS — E782 Mixed hyperlipidemia: Secondary | ICD-10-CM | POA: Diagnosis not present

## 2021-02-27 DIAGNOSIS — Z79899 Other long term (current) drug therapy: Secondary | ICD-10-CM | POA: Diagnosis not present

## 2021-02-27 DIAGNOSIS — I10 Essential (primary) hypertension: Secondary | ICD-10-CM | POA: Diagnosis not present

## 2021-02-27 DIAGNOSIS — M1A9XX1 Chronic gout, unspecified, with tophus (tophi): Secondary | ICD-10-CM | POA: Diagnosis not present

## 2021-02-27 DIAGNOSIS — F17210 Nicotine dependence, cigarettes, uncomplicated: Secondary | ICD-10-CM | POA: Diagnosis not present

## 2021-02-27 DIAGNOSIS — Z1389 Encounter for screening for other disorder: Secondary | ICD-10-CM | POA: Diagnosis not present

## 2021-02-27 DIAGNOSIS — Z0000 Encounter for general adult medical examination without abnormal findings: Secondary | ICD-10-CM | POA: Diagnosis not present

## 2021-02-27 DIAGNOSIS — I700 Atherosclerosis of aorta: Secondary | ICD-10-CM | POA: Diagnosis not present

## 2021-03-04 DIAGNOSIS — Z0000 Encounter for general adult medical examination without abnormal findings: Secondary | ICD-10-CM | POA: Diagnosis not present

## 2021-03-28 DIAGNOSIS — I10 Essential (primary) hypertension: Secondary | ICD-10-CM | POA: Diagnosis not present

## 2021-03-28 DIAGNOSIS — E782 Mixed hyperlipidemia: Secondary | ICD-10-CM | POA: Diagnosis not present

## 2021-05-17 DIAGNOSIS — D225 Melanocytic nevi of trunk: Secondary | ICD-10-CM | POA: Diagnosis not present

## 2021-05-17 DIAGNOSIS — C44519 Basal cell carcinoma of skin of other part of trunk: Secondary | ICD-10-CM | POA: Diagnosis not present

## 2021-05-17 DIAGNOSIS — D485 Neoplasm of uncertain behavior of skin: Secondary | ICD-10-CM | POA: Diagnosis not present

## 2021-05-17 DIAGNOSIS — B353 Tinea pedis: Secondary | ICD-10-CM | POA: Diagnosis not present

## 2021-05-17 DIAGNOSIS — L821 Other seborrheic keratosis: Secondary | ICD-10-CM | POA: Diagnosis not present

## 2021-05-17 DIAGNOSIS — L814 Other melanin hyperpigmentation: Secondary | ICD-10-CM | POA: Diagnosis not present

## 2021-05-27 DIAGNOSIS — I10 Essential (primary) hypertension: Secondary | ICD-10-CM | POA: Diagnosis not present

## 2021-05-27 DIAGNOSIS — E782 Mixed hyperlipidemia: Secondary | ICD-10-CM | POA: Diagnosis not present

## 2021-07-11 DIAGNOSIS — K1329 Other disturbances of oral epithelium, including tongue: Secondary | ICD-10-CM | POA: Diagnosis not present

## 2021-08-30 DIAGNOSIS — Z7184 Encounter for health counseling related to travel: Secondary | ICD-10-CM | POA: Diagnosis not present

## 2021-08-30 DIAGNOSIS — Z23 Encounter for immunization: Secondary | ICD-10-CM | POA: Diagnosis not present

## 2021-08-30 DIAGNOSIS — R195 Other fecal abnormalities: Secondary | ICD-10-CM | POA: Diagnosis not present

## 2021-08-30 DIAGNOSIS — F17200 Nicotine dependence, unspecified, uncomplicated: Secondary | ICD-10-CM | POA: Diagnosis not present

## 2021-08-30 DIAGNOSIS — F17210 Nicotine dependence, cigarettes, uncomplicated: Secondary | ICD-10-CM | POA: Diagnosis not present

## 2021-08-30 DIAGNOSIS — I10 Essential (primary) hypertension: Secondary | ICD-10-CM | POA: Diagnosis not present

## 2021-09-11 DIAGNOSIS — I10 Essential (primary) hypertension: Secondary | ICD-10-CM | POA: Diagnosis not present

## 2021-09-23 DIAGNOSIS — Z7189 Other specified counseling: Secondary | ICD-10-CM | POA: Diagnosis not present

## 2022-03-12 DIAGNOSIS — E782 Mixed hyperlipidemia: Secondary | ICD-10-CM | POA: Diagnosis not present

## 2022-03-12 DIAGNOSIS — M1A9XX1 Chronic gout, unspecified, with tophus (tophi): Secondary | ICD-10-CM | POA: Diagnosis not present

## 2022-03-12 DIAGNOSIS — N529 Male erectile dysfunction, unspecified: Secondary | ICD-10-CM | POA: Diagnosis not present

## 2022-03-12 DIAGNOSIS — I10 Essential (primary) hypertension: Secondary | ICD-10-CM | POA: Diagnosis not present

## 2022-03-12 DIAGNOSIS — I700 Atherosclerosis of aorta: Secondary | ICD-10-CM | POA: Diagnosis not present

## 2022-03-12 DIAGNOSIS — Z79899 Other long term (current) drug therapy: Secondary | ICD-10-CM | POA: Diagnosis not present

## 2022-03-12 DIAGNOSIS — Z1389 Encounter for screening for other disorder: Secondary | ICD-10-CM | POA: Diagnosis not present

## 2022-03-12 DIAGNOSIS — N401 Enlarged prostate with lower urinary tract symptoms: Secondary | ICD-10-CM | POA: Diagnosis not present

## 2022-03-12 DIAGNOSIS — Z0000 Encounter for general adult medical examination without abnormal findings: Secondary | ICD-10-CM | POA: Diagnosis not present

## 2022-03-18 DIAGNOSIS — K648 Other hemorrhoids: Secondary | ICD-10-CM | POA: Diagnosis not present

## 2022-03-18 DIAGNOSIS — Z86010 Personal history of colonic polyps: Secondary | ICD-10-CM | POA: Diagnosis not present

## 2022-03-18 DIAGNOSIS — K5730 Diverticulosis of large intestine without perforation or abscess without bleeding: Secondary | ICD-10-CM | POA: Diagnosis not present

## 2022-03-18 DIAGNOSIS — K5520 Angiodysplasia of colon without hemorrhage: Secondary | ICD-10-CM | POA: Diagnosis not present

## 2022-03-18 DIAGNOSIS — K644 Residual hemorrhoidal skin tags: Secondary | ICD-10-CM | POA: Diagnosis not present

## 2022-03-18 DIAGNOSIS — D125 Benign neoplasm of sigmoid colon: Secondary | ICD-10-CM | POA: Diagnosis not present

## 2022-03-20 DIAGNOSIS — D125 Benign neoplasm of sigmoid colon: Secondary | ICD-10-CM | POA: Diagnosis not present

## 2022-05-27 DIAGNOSIS — L570 Actinic keratosis: Secondary | ICD-10-CM | POA: Diagnosis not present

## 2022-05-27 DIAGNOSIS — L821 Other seborrheic keratosis: Secondary | ICD-10-CM | POA: Diagnosis not present

## 2022-05-27 DIAGNOSIS — D692 Other nonthrombocytopenic purpura: Secondary | ICD-10-CM | POA: Diagnosis not present

## 2022-05-27 DIAGNOSIS — D225 Melanocytic nevi of trunk: Secondary | ICD-10-CM | POA: Diagnosis not present

## 2022-05-27 DIAGNOSIS — L820 Inflamed seborrheic keratosis: Secondary | ICD-10-CM | POA: Diagnosis not present

## 2022-05-27 DIAGNOSIS — Z85828 Personal history of other malignant neoplasm of skin: Secondary | ICD-10-CM | POA: Diagnosis not present

## 2022-09-10 DIAGNOSIS — Z79899 Other long term (current) drug therapy: Secondary | ICD-10-CM | POA: Diagnosis not present

## 2022-09-10 DIAGNOSIS — F17210 Nicotine dependence, cigarettes, uncomplicated: Secondary | ICD-10-CM | POA: Diagnosis not present

## 2022-09-10 DIAGNOSIS — I10 Essential (primary) hypertension: Secondary | ICD-10-CM | POA: Diagnosis not present

## 2022-09-11 ENCOUNTER — Other Ambulatory Visit: Payer: Self-pay | Admitting: Geriatric Medicine

## 2022-09-11 DIAGNOSIS — F17210 Nicotine dependence, cigarettes, uncomplicated: Secondary | ICD-10-CM

## 2022-10-14 ENCOUNTER — Ambulatory Visit
Admission: RE | Admit: 2022-10-14 | Discharge: 2022-10-14 | Disposition: A | Discharge status: Home / Self Care | Payer: Medicare Other | Source: Ambulatory Visit | Attending: Geriatric Medicine | Admitting: Geriatric Medicine

## 2022-10-14 DIAGNOSIS — F17210 Nicotine dependence, cigarettes, uncomplicated: Secondary | ICD-10-CM

## 2022-11-25 DIAGNOSIS — H18513 Endothelial corneal dystrophy, bilateral: Secondary | ICD-10-CM | POA: Diagnosis not present

## 2022-11-25 DIAGNOSIS — H00011 Hordeolum externum right upper eyelid: Secondary | ICD-10-CM | POA: Diagnosis not present

## 2022-11-25 DIAGNOSIS — Z961 Presence of intraocular lens: Secondary | ICD-10-CM | POA: Diagnosis not present

## 2022-11-25 DIAGNOSIS — H0102A Squamous blepharitis right eye, upper and lower eyelids: Secondary | ICD-10-CM | POA: Diagnosis not present

## 2022-11-25 DIAGNOSIS — H0102B Squamous blepharitis left eye, upper and lower eyelids: Secondary | ICD-10-CM | POA: Diagnosis not present

## 2022-11-25 DIAGNOSIS — H2511 Age-related nuclear cataract, right eye: Secondary | ICD-10-CM | POA: Diagnosis not present

## 2022-12-31 DIAGNOSIS — H18513 Endothelial corneal dystrophy, bilateral: Secondary | ICD-10-CM | POA: Diagnosis not present

## 2022-12-31 DIAGNOSIS — H02831 Dermatochalasis of right upper eyelid: Secondary | ICD-10-CM | POA: Diagnosis not present

## 2022-12-31 DIAGNOSIS — Z961 Presence of intraocular lens: Secondary | ICD-10-CM | POA: Diagnosis not present

## 2022-12-31 DIAGNOSIS — H2511 Age-related nuclear cataract, right eye: Secondary | ICD-10-CM | POA: Diagnosis not present

## 2022-12-31 DIAGNOSIS — H31093 Other chorioretinal scars, bilateral: Secondary | ICD-10-CM | POA: Diagnosis not present

## 2022-12-31 DIAGNOSIS — H02834 Dermatochalasis of left upper eyelid: Secondary | ICD-10-CM | POA: Diagnosis not present

## 2022-12-31 DIAGNOSIS — H0102B Squamous blepharitis left eye, upper and lower eyelids: Secondary | ICD-10-CM | POA: Diagnosis not present

## 2022-12-31 DIAGNOSIS — H0102A Squamous blepharitis right eye, upper and lower eyelids: Secondary | ICD-10-CM | POA: Diagnosis not present

## 2023-02-17 DIAGNOSIS — H2511 Age-related nuclear cataract, right eye: Secondary | ICD-10-CM | POA: Diagnosis not present

## 2023-02-19 DIAGNOSIS — H2511 Age-related nuclear cataract, right eye: Secondary | ICD-10-CM | POA: Diagnosis not present

## 2023-04-14 ENCOUNTER — Encounter (HOSPITAL_COMMUNITY): Payer: Self-pay

## 2023-04-14 ENCOUNTER — Emergency Department (HOSPITAL_COMMUNITY): Payer: Medicare HMO

## 2023-04-14 ENCOUNTER — Emergency Department (HOSPITAL_COMMUNITY)
Admission: EM | Admit: 2023-04-14 | Discharge: 2023-04-14 | Disposition: A | Discharge status: Home / Self Care | Payer: Medicare HMO | Attending: Emergency Medicine | Admitting: Emergency Medicine

## 2023-04-14 ENCOUNTER — Other Ambulatory Visit: Payer: Self-pay

## 2023-04-14 DIAGNOSIS — I10 Essential (primary) hypertension: Secondary | ICD-10-CM | POA: Diagnosis not present

## 2023-04-14 DIAGNOSIS — N281 Cyst of kidney, acquired: Secondary | ICD-10-CM | POA: Diagnosis not present

## 2023-04-14 DIAGNOSIS — N23 Unspecified renal colic: Secondary | ICD-10-CM | POA: Diagnosis not present

## 2023-04-14 DIAGNOSIS — N132 Hydronephrosis with renal and ureteral calculous obstruction: Secondary | ICD-10-CM | POA: Insufficient documentation

## 2023-04-14 DIAGNOSIS — R109 Unspecified abdominal pain: Secondary | ICD-10-CM | POA: Diagnosis present

## 2023-04-14 DIAGNOSIS — K5730 Diverticulosis of large intestine without perforation or abscess without bleeding: Secondary | ICD-10-CM | POA: Diagnosis not present

## 2023-04-14 DIAGNOSIS — N130 Hydronephrosis with ureteropelvic junction obstruction: Secondary | ICD-10-CM | POA: Diagnosis not present

## 2023-04-14 DIAGNOSIS — N200 Calculus of kidney: Secondary | ICD-10-CM

## 2023-04-14 DIAGNOSIS — Q6211 Congenital occlusion of ureteropelvic junction: Secondary | ICD-10-CM

## 2023-04-14 LAB — URINALYSIS, ROUTINE W REFLEX MICROSCOPIC
Bacteria, UA: NONE SEEN
Bilirubin Urine: NEGATIVE
Glucose, UA: NEGATIVE mg/dL
Ketones, ur: NEGATIVE mg/dL
Leukocytes,Ua: NEGATIVE
Nitrite: NEGATIVE
Protein, ur: NEGATIVE mg/dL
RBC / HPF: >50 RBC/hpf (ref 0–5)
Specific Gravity, Urine: 1.017 (ref 1.005–1.030)
pH: 5 (ref 5.0–8.0)

## 2023-04-14 LAB — BASIC METABOLIC PANEL
Anion gap: 8 (ref 5–15)
BUN: 16 mg/dL (ref 8–23)
CO2: 24 mmol/L (ref 22–32)
Calcium: 9.5 mg/dL (ref 8.9–10.3)
Chloride: 105 mmol/L (ref 98–111)
Creatinine, Ser: 0.86 mg/dL (ref 0.61–1.24)
GFR, Estimated: >60 mL/min (ref >60)
Glucose, Bld: 148 mg/dL — ABNORMAL HIGH (ref 70–99)
Potassium: 4.4 mmol/L (ref 3.5–5.1)
Sodium: 137 mmol/L (ref 135–145)

## 2023-04-14 LAB — CBC
HCT: 44 % (ref 39.0–52.0)
Hemoglobin: 14.9 g/dL (ref 13.0–17.0)
MCH: 31.8 pg (ref 26.0–34.0)
MCHC: 33.9 g/dL (ref 30.0–36.0)
MCV: 94 fL (ref 80.0–100.0)
Platelets: 193 10*3/uL (ref 150–400)
RBC: 4.68 MIL/uL (ref 4.22–5.81)
RDW: 13.2 % (ref 11.5–15.5)
WBC: 9.4 10*3/uL (ref 4.0–10.5)
nRBC: 0 % (ref 0.0–0.2)

## 2023-04-14 MED ORDER — TAMSULOSIN HCL 0.4 MG PO CAPS
0.4000 mg | ORAL_CAPSULE | ORAL | Status: AC
  Administered 2023-04-14: 0.4 mg via ORAL
  Filled 2023-04-14: qty 1

## 2023-04-14 MED ORDER — ONDANSETRON HCL 4 MG/2ML IJ SOLN
4.0000 mg | Freq: Once | INTRAMUSCULAR | Status: AC
  Administered 2023-04-14: 4 mg via INTRAVENOUS
  Filled 2023-04-14: qty 2

## 2023-04-14 MED ORDER — KETOROLAC TROMETHAMINE 15 MG/ML IJ SOLN
10.0000 mg | Freq: Once | INTRAMUSCULAR | Status: AC
  Administered 2023-04-14: 10 mg via INTRAVENOUS
  Filled 2023-04-14: qty 1

## 2023-04-14 MED ORDER — OXYCODONE HCL 5 MG PO TABS: 5.0000 mg | ORAL_TABLET | ORAL | 0 refills | Status: AC | PRN

## 2023-04-14 MED ORDER — TAMSULOSIN HCL 0.4 MG PO CAPS: 0.4000 mg | ORAL_CAPSULE | Freq: Every day | ORAL | 0 refills | Status: AC

## 2023-04-14 NOTE — Discharge Instructions (Addendum)
You were seen for your kidney stone in the emergency department.   At home, please take Tylenol for your pain and the Flomax to ensure that you passed your kidney stone. You may also take the oxycodone we have prescribed you for any breakthrough pain that may have.  Do not take this before driving or operating heavy machinery.  Do not take this medication with alcohol.  Strain all of your urine and put the kidney stone in a Ziploc bag and take it to your urology appointment.  Check your MyChart online for the results of any tests that had not resulted by the time you left the emergency department.   Follow-up with your primary doctor in 2-3 days regarding your visit and please talk to them about the kidney cyst that was seen on your CT scan and if you need any additional imaging.  Follow-up with urology in 1 to 2 weeks.  Return immediately to the emergency department if you experience any of the following: Fever, worsening pain, or any other concerning symptoms.    Thank you for visiting our Emergency Department. It was a pleasure taking care of you today.

## 2023-04-14 NOTE — ED Provider Notes (Signed)
[facility] EMERGENCY DEPARTMENT AT [facility] Provider Note   CSN: [id number] Arrival date & time: 04/14/23  0827     History  Chief Complaint  Patient presents with   Flank Pain    BARBARA KENG is a 72 y.o. male.  72 year old male with a history of hypertension, hyperlipidemia, gout on allopurinol, and prior kidney stones who presents emergency department with right flank pain.  Started at 2 AM last night and has not been able to sleep because of this.  Says he also has had an episode of nausea and vomiting.  Has been having urgency but no hematuria and some mild dysuria.  No fevers.  Did have a kidney stone several years ago with similar symptoms.       Home Medications Prior to Admission medications   Medication Sig Start Date End Date Taking? Authorizing Provider  oxyCODONE (ROXICODONE) 5 MG immediate release tablet Take 1 tablet (5 mg total) by mouth every 4 (four) hours as needed for severe pain. 04/14/23  Yes Rondel Baton, MD  tamsulosin (FLOMAX) 0.4 MG CAPS capsule Take 1 capsule (0.4 mg total) by mouth daily. 04/14/23  Yes Rondel Baton, MD  allopurinol (ZYLOPRIM) 300 MG tablet Take 300 mg by mouth daily.    [provider]  HYDROcodone-acetaminophen (NORCO) 5-325 MG tablet Take 1 tablet by mouth every 6 (six) hours as needed. Patient not taking: Reported on 10/28/2018 09/14/18   Cindee Salt, MD  ibuprofen (ADVIL,MOTRIN) 400 MG tablet Take 400 mg by mouth every 6 (six) hours as needed for moderate pain.    [provider]  ramipril (ALTACE) 10 MG capsule Take 10 mg by mouth daily.    [provider]  sulfamethoxazole-trimethoprim (BACTRIM DS,SEPTRA DS) 800-160 MG tablet Take 1 tablet by mouth 2 (two) times daily. for 10 days 09/22/18   [provider]      Allergies    Patient has no known allergies.    Review of Systems   Review of Systems  Physical Exam Updated Vital Signs BP (!) 175/85 (BP Location:  Right Arm)   Pulse 63   Temp 98.2 F (36.8 C) (Oral)   Resp 17   Ht 5\' 8"  (1.727 m)   Wt 79.4 kg   SpO2 99%   BMI 26.61 kg/m  Physical Exam Vitals and nursing note reviewed.  Constitutional:      General: He is not in acute distress.    Appearance: He is well-developed.  HENT:     Head: Normocephalic and atraumatic.     Right Ear: External ear normal.     Left Ear: External ear normal.     Nose: Nose normal.  Eyes:     Extraocular Movements: Extraocular movements intact.     Conjunctiva/sclera: Conjunctivae normal.     Pupils: Pupils are equal, round, and reactive to light.  Cardiovascular:     Rate and Rhythm: Normal rate and regular rhythm.  Pulmonary:     Effort: Pulmonary effort is normal. No respiratory distress.  Abdominal:     General: There is no distension.     Palpations: Abdomen is soft. There is no mass.     Tenderness: There is no abdominal tenderness. There is right CVA tenderness. There is no left CVA tenderness or guarding.  Musculoskeletal:     Cervical back: Normal range of motion and neck supple.  Skin:    General: Skin is warm and dry.  Neurological:  Mental Status: He is alert. Mental status is at baseline.  Psychiatric:        Mood and Affect: Mood normal.        Behavior: Behavior normal.     ED Results / Procedures / Treatments   Labs (all labs ordered are listed, but only abnormal results are displayed) Labs Reviewed  BASIC METABOLIC PANEL - Abnormal; Notable for the following components:      Result Value   Glucose, Bld 148 (*)    All other components within normal limits  URINALYSIS, ROUTINE W REFLEX MICROSCOPIC - Abnormal; Notable for the following components:   Hgb urine dipstick LARGE (*)    All other components within normal limits  CBC    EKG None  Radiology CT Renal Stone Study  Result Date: 04/14/2023 CLINICAL DATA:  Abdominal/flank pain, stone suspected EXAM: CT ABDOMEN AND PELVIS WITHOUT CONTRAST TECHNIQUE:  Multidetector CT imaging of the abdomen and pelvis was performed following the standard protocol without IV contrast. RADIATION DOSE REDUCTION: This exam was performed according to the departmental dose-optimization program which includes automated exposure control, adjustment of the mA and/or kV according to patient size and/or use of iterative reconstruction technique. COMPARISON:  12/02/2003 FINDINGS: Lower chest: No acute abnormality Hepatobiliary: No focal hepatic abnormality. Gallbladder unremarkable. Pancreas: No focal abnormality or ductal dilatation. Spleen: No focal abnormality.  Normal size. Adrenals/Urinary Tract: 2.7 cm low-density right adrenal nodule measures 10 Hounsfield units. This was present on study from 2004, compatible with adenoma. Left adrenal gland unremarkable. 2.3 cm low-density lesion in the midpole of the right kidney compared to 1.5 cm previously. It is difficult to characterize on this noncontrast study but very slow growth over 20 years suggests benign cyst. Several low-density lesions in the mid and lower pole of the left kidney, the largest 2.1 cm in the midpole. These were not definitively present on prior study. No hydronephrosis on the left. Mild hydronephrosis on the right due to 2-3 mm right distal ureteral stone. Urinary bladder unremarkable. Stomach/Bowel: Normal appendix. Sigmoid diverticulosis. No active diverticulitis. Stomach and small bowel decompressed, unremarkable. Vascular/Lymphatic: No evidence of aneurysm or adenopathy. Aortic atherosclerosis. Reproductive: No visible focal abnormality. Other: No free fluid or free air. Musculoskeletal: No acute bony abnormality. IMPRESSION: 2-3 mm distal right ureteral stone with mild right hydronephrosis. Bilateral renal low-density lesions difficult to characterize on this noncontrast study. A right renal lesion likely reflects a benign cyst given the slow growth over 20 years. The left renal lesions are new since prior study.  These could be further characterized with renal ultrasound. Sigmoid diverticulosis. Aortic atherosclerosis. Electronically Signed   By: Charlett Nose M.D.   On: 04/14/2023 09:32    Procedures Procedures   Medications Ordered in ED Medications  ketorolac (TORADOL) 15 MG/ML injection 10 mg (10 mg Intravenous Given 04/14/23 0925)  ondansetron (ZOFRAN) injection 4 mg (4 mg Intravenous Given 04/14/23 0926)  tamsulosin (FLOMAX) capsule 0.4 mg (0.4 mg Oral Given 04/14/23 1610)    ED Course/ Medical Decision Making/ A&P                             Medical Decision Making Amount and/or Complexity of Data Reviewed Labs: ordered. Radiology: ordered.  Risk Prescription drug management.   IHSAN NOMURA is a 72 y.o. male with comorbidities that complicate the patient evaluation including kidney stones who presents emergency department with right flank pain  Initial Ddx:  Nephrolithiasis, pyelonephritis, lumbar  radiculopathy, AAA  MDM:  Feel the patient likely has nephrolithiasis based on their symptoms.  Will obtain urinalysis to assess for pyelonephritis as well but feel this is less likely given the lack of systemic symptoms.  Given age will also assess for AAA with CT scan. No symptoms that would be suggestive of radiculopathy.  Plan:  Labs Urinalysis CT stone protocol Pain medication  ED Summary/Re-evaluation:  Patient had CT that showed mild hydronephrosis from a 2 to 3 mm distal right kidney stone.  No AKI or UTI on labs. Patient's pain was able to be controlled with Toradol and has nausea and vomiting with Zofran.  Patient given a strainer in the emergency department and instructed to follow-up with urology in 1 to 2 weeks.  Also given a prescription of oxycodone and instructed to take it for any breakthrough pain.  Also instructed to follow-up with his primary care doctor about his kidney cyst.  This patient presents to the ED for concern of complaints listed in HPI, this involves  an extensive number of treatment options, and is a complaint that carries with it a high risk of complications and morbidity. Disposition including potential need for admission considered.   Dispo: DC Home. Return precautions discussed including, but not limited to, those listed in the AVS. Allowed pt time to ask questions which were answered fully prior to dc.  Records reviewed Outpatient Clinic Notes The following labs were independently interpreted: Chemistry and show no acute abnormality I independently reviewed the following imaging with scope of interpretation limited to determining acute life threatening conditions related to emergency care: CT Abdomen/Pelvis and agree with the radiologist interpretation with the following exceptions: none I personally reviewed and interpreted cardiac monitoring: normal sinus rhythm  I personally reviewed and interpreted the pt's EKG: see above for interpretation  I have reviewed the patients home medications and made adjustments as needed Social Determinants of health:  Elderly  Final Clinical Impression(s) / ED Diagnoses Final diagnoses:  Nephrolithiasis  Hydronephrosis with ureteropelvic junction (UPJ) obstruction  Renal cyst    Rx / DC Orders ED Discharge Orders          Ordered    oxyCODONE (ROXICODONE) 5 MG immediate release tablet  Every 4 hours PRN        04/14/23 1035    tamsulosin (FLOMAX) 0.4 MG CAPS capsule  Daily        04/14/23 1035              Rondel Baton, MD 04/14/23 1057

## 2023-04-14 NOTE — ED Triage Notes (Signed)
Pt to er, pt states that he was awakened around 2am with R back/flank pain, states that 25 years ago he had a kidney stone and this feels similar.

## 2023-05-20 ENCOUNTER — Other Ambulatory Visit: Payer: Self-pay | Admitting: Urology

## 2023-05-20 DIAGNOSIS — N202 Calculus of kidney with calculus of ureter: Secondary | ICD-10-CM | POA: Diagnosis not present

## 2023-05-20 DIAGNOSIS — N401 Enlarged prostate with lower urinary tract symptoms: Secondary | ICD-10-CM | POA: Diagnosis not present

## 2023-05-20 DIAGNOSIS — N281 Cyst of kidney, acquired: Secondary | ICD-10-CM | POA: Diagnosis not present

## 2023-05-20 DIAGNOSIS — N5201 Erectile dysfunction due to arterial insufficiency: Secondary | ICD-10-CM | POA: Diagnosis not present

## 2023-05-20 DIAGNOSIS — R350 Frequency of micturition: Secondary | ICD-10-CM | POA: Diagnosis not present

## 2023-06-03 DIAGNOSIS — N401 Enlarged prostate with lower urinary tract symptoms: Secondary | ICD-10-CM | POA: Diagnosis not present

## 2023-06-03 DIAGNOSIS — N529 Male erectile dysfunction, unspecified: Secondary | ICD-10-CM | POA: Diagnosis not present

## 2023-06-03 DIAGNOSIS — Z79899 Other long term (current) drug therapy: Secondary | ICD-10-CM | POA: Diagnosis not present

## 2023-06-03 DIAGNOSIS — E782 Mixed hyperlipidemia: Secondary | ICD-10-CM | POA: Diagnosis not present

## 2023-06-03 DIAGNOSIS — F17210 Nicotine dependence, cigarettes, uncomplicated: Secondary | ICD-10-CM | POA: Diagnosis not present

## 2023-06-03 DIAGNOSIS — I10 Essential (primary) hypertension: Secondary | ICD-10-CM | POA: Diagnosis not present

## 2023-06-03 DIAGNOSIS — M1A9XX1 Chronic gout, unspecified, with tophus (tophi): Secondary | ICD-10-CM | POA: Diagnosis not present

## 2023-06-03 DIAGNOSIS — I700 Atherosclerosis of aorta: Secondary | ICD-10-CM | POA: Diagnosis not present

## 2023-06-03 DIAGNOSIS — Z0000 Encounter for general adult medical examination without abnormal findings: Secondary | ICD-10-CM | POA: Diagnosis not present

## 2023-07-06 ENCOUNTER — Encounter: Payer: Self-pay | Admitting: Urology

## 2023-07-07 ENCOUNTER — Other Ambulatory Visit: Payer: Medicare HMO

## 2023-07-23 ENCOUNTER — Inpatient Hospital Stay: Admission: RE | Admit: 2023-07-23 | Payer: Medicare HMO | Source: Ambulatory Visit

## 2023-08-13 ENCOUNTER — Ambulatory Visit
Admission: RE | Admit: 2023-08-13 | Discharge: 2023-08-13 | Disposition: A | Discharge status: Home / Self Care | Payer: Medicare HMO | Source: Ambulatory Visit | Attending: Urology | Admitting: Urology

## 2023-08-13 DIAGNOSIS — I700 Atherosclerosis of aorta: Secondary | ICD-10-CM | POA: Diagnosis not present

## 2023-08-13 DIAGNOSIS — N281 Cyst of kidney, acquired: Secondary | ICD-10-CM

## 2023-08-13 MED ORDER — GADOPICLENOL 0.5 MMOL/ML IV SOLN
8.0000 mL | Freq: Once | INTRAVENOUS | Status: AC | PRN
  Administered 2023-08-13: 8 mL via INTRAVENOUS

## 2023-08-19 DIAGNOSIS — R3915 Urgency of urination: Secondary | ICD-10-CM | POA: Diagnosis not present

## 2023-08-19 DIAGNOSIS — N3943 Post-void dribbling: Secondary | ICD-10-CM | POA: Diagnosis not present

## 2023-08-19 DIAGNOSIS — N281 Cyst of kidney, acquired: Secondary | ICD-10-CM | POA: Diagnosis not present

## 2023-08-19 DIAGNOSIS — N401 Enlarged prostate with lower urinary tract symptoms: Secondary | ICD-10-CM | POA: Diagnosis not present

## 2023-08-19 DIAGNOSIS — N200 Calculus of kidney: Secondary | ICD-10-CM | POA: Diagnosis not present

## 2023-10-21 DIAGNOSIS — H02834 Dermatochalasis of left upper eyelid: Secondary | ICD-10-CM | POA: Diagnosis not present

## 2023-10-21 DIAGNOSIS — H0102A Squamous blepharitis right eye, upper and lower eyelids: Secondary | ICD-10-CM | POA: Diagnosis not present

## 2023-10-21 DIAGNOSIS — H26492 Other secondary cataract, left eye: Secondary | ICD-10-CM | POA: Diagnosis not present

## 2023-10-21 DIAGNOSIS — H18513 Endothelial corneal dystrophy, bilateral: Secondary | ICD-10-CM | POA: Diagnosis not present

## 2023-10-21 DIAGNOSIS — H31093 Other chorioretinal scars, bilateral: Secondary | ICD-10-CM | POA: Diagnosis not present

## 2023-10-21 DIAGNOSIS — Z961 Presence of intraocular lens: Secondary | ICD-10-CM | POA: Diagnosis not present

## 2023-10-21 DIAGNOSIS — H0102B Squamous blepharitis left eye, upper and lower eyelids: Secondary | ICD-10-CM | POA: Diagnosis not present

## 2023-10-21 DIAGNOSIS — H02831 Dermatochalasis of right upper eyelid: Secondary | ICD-10-CM | POA: Diagnosis not present

## 2023-11-18 DIAGNOSIS — R3915 Urgency of urination: Secondary | ICD-10-CM | POA: Diagnosis not present

## 2023-11-18 DIAGNOSIS — R350 Frequency of micturition: Secondary | ICD-10-CM | POA: Diagnosis not present

## 2023-11-18 DIAGNOSIS — N5201 Erectile dysfunction due to arterial insufficiency: Secondary | ICD-10-CM | POA: Diagnosis not present

## 2023-11-18 DIAGNOSIS — N401 Enlarged prostate with lower urinary tract symptoms: Secondary | ICD-10-CM | POA: Diagnosis not present

## 2023-11-18 DIAGNOSIS — N3943 Post-void dribbling: Secondary | ICD-10-CM | POA: Diagnosis not present

## 2023-11-26 ENCOUNTER — Other Ambulatory Visit: Payer: Self-pay | Admitting: Internal Medicine

## 2023-11-26 DIAGNOSIS — I10 Essential (primary) hypertension: Secondary | ICD-10-CM | POA: Diagnosis not present

## 2023-11-26 DIAGNOSIS — J439 Emphysema, unspecified: Secondary | ICD-10-CM | POA: Diagnosis not present

## 2023-11-26 DIAGNOSIS — N200 Calculus of kidney: Secondary | ICD-10-CM | POA: Diagnosis not present

## 2023-11-26 DIAGNOSIS — N401 Enlarged prostate with lower urinary tract symptoms: Secondary | ICD-10-CM | POA: Diagnosis not present

## 2023-11-26 DIAGNOSIS — Z122 Encounter for screening for malignant neoplasm of respiratory organs: Secondary | ICD-10-CM

## 2023-11-26 DIAGNOSIS — N281 Cyst of kidney, acquired: Secondary | ICD-10-CM | POA: Diagnosis not present

## 2023-11-26 DIAGNOSIS — E782 Mixed hyperlipidemia: Secondary | ICD-10-CM | POA: Diagnosis not present

## 2023-11-26 DIAGNOSIS — I700 Atherosclerosis of aorta: Secondary | ICD-10-CM | POA: Diagnosis not present

## 2023-11-26 DIAGNOSIS — N529 Male erectile dysfunction, unspecified: Secondary | ICD-10-CM | POA: Diagnosis not present

## 2023-11-26 DIAGNOSIS — M1A9XX1 Chronic gout, unspecified, with tophus (tophi): Secondary | ICD-10-CM | POA: Diagnosis not present

## 2023-12-24 ENCOUNTER — Ambulatory Visit
Admission: RE | Admit: 2023-12-24 | Discharge: 2023-12-24 | Disposition: A | Discharge status: Home / Self Care | Payer: Medicare HMO | Source: Ambulatory Visit | Attending: Internal Medicine | Admitting: Internal Medicine

## 2023-12-24 DIAGNOSIS — Z122 Encounter for screening for malignant neoplasm of respiratory organs: Secondary | ICD-10-CM

## 2023-12-24 DIAGNOSIS — F17210 Nicotine dependence, cigarettes, uncomplicated: Secondary | ICD-10-CM | POA: Diagnosis not present

## 2024-05-05 DIAGNOSIS — I10 Essential (primary) hypertension: Secondary | ICD-10-CM | POA: Diagnosis not present

## 2024-05-05 DIAGNOSIS — J439 Emphysema, unspecified: Secondary | ICD-10-CM | POA: Diagnosis not present

## 2024-05-14 DIAGNOSIS — J439 Emphysema, unspecified: Secondary | ICD-10-CM | POA: Diagnosis not present

## 2024-05-14 DIAGNOSIS — E782 Mixed hyperlipidemia: Secondary | ICD-10-CM | POA: Diagnosis not present

## 2024-05-14 DIAGNOSIS — I10 Essential (primary) hypertension: Secondary | ICD-10-CM | POA: Diagnosis not present

## 2024-05-14 DIAGNOSIS — N401 Enlarged prostate with lower urinary tract symptoms: Secondary | ICD-10-CM | POA: Diagnosis not present

## 2024-06-03 DIAGNOSIS — I10 Essential (primary) hypertension: Secondary | ICD-10-CM | POA: Diagnosis not present

## 2024-06-03 DIAGNOSIS — J439 Emphysema, unspecified: Secondary | ICD-10-CM | POA: Diagnosis not present

## 2024-06-13 DIAGNOSIS — E782 Mixed hyperlipidemia: Secondary | ICD-10-CM | POA: Diagnosis not present

## 2024-06-13 DIAGNOSIS — J439 Emphysema, unspecified: Secondary | ICD-10-CM | POA: Diagnosis not present

## 2024-06-13 DIAGNOSIS — N401 Enlarged prostate with lower urinary tract symptoms: Secondary | ICD-10-CM | POA: Diagnosis not present

## 2024-06-13 DIAGNOSIS — I10 Essential (primary) hypertension: Secondary | ICD-10-CM | POA: Diagnosis not present

## 2024-07-03 DIAGNOSIS — I10 Essential (primary) hypertension: Secondary | ICD-10-CM | POA: Diagnosis not present

## 2024-07-03 DIAGNOSIS — J439 Emphysema, unspecified: Secondary | ICD-10-CM | POA: Diagnosis not present

## 2024-07-08 DIAGNOSIS — E782 Mixed hyperlipidemia: Secondary | ICD-10-CM | POA: Diagnosis not present

## 2024-07-08 DIAGNOSIS — N200 Calculus of kidney: Secondary | ICD-10-CM | POA: Diagnosis not present

## 2024-07-08 DIAGNOSIS — Z1211 Encounter for screening for malignant neoplasm of colon: Secondary | ICD-10-CM | POA: Diagnosis not present

## 2024-07-08 DIAGNOSIS — I10 Essential (primary) hypertension: Secondary | ICD-10-CM | POA: Diagnosis not present

## 2024-07-08 DIAGNOSIS — F17210 Nicotine dependence, cigarettes, uncomplicated: Secondary | ICD-10-CM | POA: Diagnosis not present

## 2024-07-08 DIAGNOSIS — M1A9XX1 Chronic gout, unspecified, with tophus (tophi): Secondary | ICD-10-CM | POA: Diagnosis not present

## 2024-07-08 DIAGNOSIS — Z125 Encounter for screening for malignant neoplasm of prostate: Secondary | ICD-10-CM | POA: Diagnosis not present

## 2024-07-08 DIAGNOSIS — Z0000 Encounter for general adult medical examination without abnormal findings: Secondary | ICD-10-CM | POA: Diagnosis not present

## 2024-07-08 DIAGNOSIS — R7309 Other abnormal glucose: Secondary | ICD-10-CM | POA: Diagnosis not present

## 2024-07-08 DIAGNOSIS — N529 Male erectile dysfunction, unspecified: Secondary | ICD-10-CM | POA: Diagnosis not present

## 2024-07-08 DIAGNOSIS — Z23 Encounter for immunization: Secondary | ICD-10-CM | POA: Diagnosis not present

## 2024-07-08 DIAGNOSIS — N401 Enlarged prostate with lower urinary tract symptoms: Secondary | ICD-10-CM | POA: Diagnosis not present

## 2024-07-08 DIAGNOSIS — Z79899 Other long term (current) drug therapy: Secondary | ICD-10-CM | POA: Diagnosis not present

## 2024-07-08 DIAGNOSIS — Z1331 Encounter for screening for depression: Secondary | ICD-10-CM | POA: Diagnosis not present

## 2024-07-14 DIAGNOSIS — J439 Emphysema, unspecified: Secondary | ICD-10-CM | POA: Diagnosis not present

## 2024-07-14 DIAGNOSIS — E782 Mixed hyperlipidemia: Secondary | ICD-10-CM | POA: Diagnosis not present

## 2024-07-14 DIAGNOSIS — I10 Essential (primary) hypertension: Secondary | ICD-10-CM | POA: Diagnosis not present

## 2024-07-14 DIAGNOSIS — N401 Enlarged prostate with lower urinary tract symptoms: Secondary | ICD-10-CM | POA: Diagnosis not present

## 2024-07-21 ENCOUNTER — Other Ambulatory Visit (HOSPITAL_COMMUNITY): Payer: Self-pay | Admitting: Internal Medicine

## 2024-07-21 DIAGNOSIS — R011 Cardiac murmur, unspecified: Secondary | ICD-10-CM

## 2024-08-02 DIAGNOSIS — J439 Emphysema, unspecified: Secondary | ICD-10-CM | POA: Diagnosis not present

## 2024-08-02 DIAGNOSIS — I10 Essential (primary) hypertension: Secondary | ICD-10-CM | POA: Diagnosis not present

## 2024-08-11 ENCOUNTER — Ambulatory Visit (HOSPITAL_COMMUNITY)
Admission: RE | Admit: 2024-08-11 | Discharge: 2024-08-11 | Disposition: A | Discharge status: Home / Self Care | Source: Ambulatory Visit | Attending: Vascular Surgery | Admitting: Vascular Surgery

## 2024-08-11 DIAGNOSIS — R011 Cardiac murmur, unspecified: Secondary | ICD-10-CM | POA: Insufficient documentation

## 2024-08-11 LAB — ECHOCARDIOGRAM COMPLETE
AR max vel: 2 cm2
AV Area VTI: 1.99 cm2
AV Area mean vel: 1.84 cm2
AV Mean grad: 7.5 mmHg
AV Peak grad: 16.5 mmHg
Ao pk vel: 2.03 m/s
Area-P 1/2: 2.87 cm2
S' Lateral: 3.01 cm

## 2024-08-14 DIAGNOSIS — E782 Mixed hyperlipidemia: Secondary | ICD-10-CM | POA: Diagnosis not present

## 2024-08-14 DIAGNOSIS — I10 Essential (primary) hypertension: Secondary | ICD-10-CM | POA: Diagnosis not present

## 2024-08-14 DIAGNOSIS — N401 Enlarged prostate with lower urinary tract symptoms: Secondary | ICD-10-CM | POA: Diagnosis not present

## 2024-08-14 DIAGNOSIS — J439 Emphysema, unspecified: Secondary | ICD-10-CM | POA: Diagnosis not present

## 2024-09-01 DIAGNOSIS — J439 Emphysema, unspecified: Secondary | ICD-10-CM | POA: Diagnosis not present

## 2024-09-01 DIAGNOSIS — I10 Essential (primary) hypertension: Secondary | ICD-10-CM | POA: Diagnosis not present

## 2024-09-13 DIAGNOSIS — N401 Enlarged prostate with lower urinary tract symptoms: Secondary | ICD-10-CM | POA: Diagnosis not present

## 2024-09-13 DIAGNOSIS — I10 Essential (primary) hypertension: Secondary | ICD-10-CM | POA: Diagnosis not present

## 2024-09-13 DIAGNOSIS — J439 Emphysema, unspecified: Secondary | ICD-10-CM | POA: Diagnosis not present

## 2024-09-13 DIAGNOSIS — E782 Mixed hyperlipidemia: Secondary | ICD-10-CM | POA: Diagnosis not present

## 2024-09-20 DIAGNOSIS — I700 Atherosclerosis of aorta: Secondary | ICD-10-CM | POA: Diagnosis not present

## 2024-09-20 DIAGNOSIS — E7800 Pure hypercholesterolemia, unspecified: Secondary | ICD-10-CM | POA: Diagnosis not present

## 2024-09-20 DIAGNOSIS — F17210 Nicotine dependence, cigarettes, uncomplicated: Secondary | ICD-10-CM | POA: Diagnosis not present

## 2024-09-20 DIAGNOSIS — I10 Essential (primary) hypertension: Secondary | ICD-10-CM | POA: Diagnosis not present

## 2024-09-20 DIAGNOSIS — M109 Gout, unspecified: Secondary | ICD-10-CM | POA: Diagnosis not present

## 2024-10-01 DIAGNOSIS — I10 Essential (primary) hypertension: Secondary | ICD-10-CM | POA: Diagnosis not present

## 2024-10-01 DIAGNOSIS — J439 Emphysema, unspecified: Secondary | ICD-10-CM | POA: Diagnosis not present

## 2024-10-14 DIAGNOSIS — E782 Mixed hyperlipidemia: Secondary | ICD-10-CM | POA: Diagnosis not present

## 2024-10-14 DIAGNOSIS — I10 Essential (primary) hypertension: Secondary | ICD-10-CM | POA: Diagnosis not present

## 2024-10-14 DIAGNOSIS — J439 Emphysema, unspecified: Secondary | ICD-10-CM | POA: Diagnosis not present

## 2024-10-14 DIAGNOSIS — N401 Enlarged prostate with lower urinary tract symptoms: Secondary | ICD-10-CM | POA: Diagnosis not present

## 2024-10-31 DIAGNOSIS — J439 Emphysema, unspecified: Secondary | ICD-10-CM | POA: Diagnosis not present

## 2024-10-31 DIAGNOSIS — I10 Essential (primary) hypertension: Secondary | ICD-10-CM | POA: Diagnosis not present
